# Patient Record
Sex: Female | Born: 1955
Health system: Southern US, Community
[De-identification: ages and names within clinical notes are randomized; demographics above are authoritative.]

## PROBLEM LIST (undated history)

## (undated) DIAGNOSIS — N739 Female pelvic inflammatory disease, unspecified: Secondary | ICD-10-CM

## (undated) DIAGNOSIS — N809 Endometriosis, unspecified: Secondary | ICD-10-CM

## (undated) DIAGNOSIS — E785 Hyperlipidemia, unspecified: Secondary | ICD-10-CM

## (undated) DIAGNOSIS — D649 Anemia, unspecified: Secondary | ICD-10-CM

## (undated) DIAGNOSIS — N946 Dysmenorrhea, unspecified: Secondary | ICD-10-CM

## (undated) DIAGNOSIS — N979 Female infertility, unspecified: Secondary | ICD-10-CM

## (undated) DIAGNOSIS — E349 Endocrine disorder, unspecified: Secondary | ICD-10-CM

## (undated) DIAGNOSIS — J45909 Unspecified asthma, uncomplicated: Secondary | ICD-10-CM

## (undated) HISTORY — DX: Endocrine disorder, unspecified: E34.9

## (undated) HISTORY — DX: Endometriosis, unspecified: N80.9

## (undated) HISTORY — DX: Female infertility, unspecified: N97.9

## (undated) HISTORY — DX: Female pelvic inflammatory disease, unspecified: N73.9

## (undated) HISTORY — PX: PELVIC LAPAROSCOPY: SHX162

## (undated) HISTORY — DX: Anemia, unspecified: D64.9

## (undated) HISTORY — DX: Hyperlipidemia, unspecified: E78.5

## (undated) HISTORY — PX: ABDOMINAL SURGERY: SHX537

## (undated) HISTORY — DX: Dysmenorrhea, unspecified: N94.6

## (undated) HISTORY — PX: ROTATOR CUFF REPAIR: SHX139

---

## 2004-01-01 ENCOUNTER — Other Ambulatory Visit: Admission: RE | Admit: 2004-01-01 | Discharge: 2004-01-01 | Payer: Self-pay | Admitting: Family Medicine

## 2005-01-06 ENCOUNTER — Other Ambulatory Visit: Admission: RE | Admit: 2005-01-06 | Discharge: 2005-01-06 | Payer: Self-pay | Admitting: Family Medicine

## 2007-12-26 ENCOUNTER — Other Ambulatory Visit: Admission: RE | Admit: 2007-12-26 | Discharge: 2007-12-26 | Payer: Self-pay | Admitting: Family Medicine

## 2010-01-15 ENCOUNTER — Other Ambulatory Visit: Admission: RE | Admit: 2010-01-15 | Discharge: 2010-01-15 | Payer: Self-pay | Admitting: Family Medicine

## 2011-06-07 ENCOUNTER — Ambulatory Visit (INDEPENDENT_AMBULATORY_CARE_PROVIDER_SITE_OTHER): Payer: BC Managed Care – PPO | Admitting: Internal Medicine

## 2011-06-07 DIAGNOSIS — R0989 Other specified symptoms and signs involving the circulatory and respiratory systems: Secondary | ICD-10-CM

## 2011-06-07 DIAGNOSIS — R0609 Other forms of dyspnea: Secondary | ICD-10-CM

## 2011-06-07 LAB — PULMONARY FUNCTION TEST

## 2011-06-07 NOTE — Progress Notes (Signed)
PFT done today. 

## 2011-06-14 ENCOUNTER — Encounter: Payer: Self-pay | Admitting: Family Medicine

## 2011-06-21 ENCOUNTER — Other Ambulatory Visit: Payer: Self-pay | Admitting: Family Medicine

## 2011-06-21 ENCOUNTER — Ambulatory Visit
Admission: RE | Admit: 2011-06-21 | Discharge: 2011-06-21 | Disposition: A | Discharge status: Home / Self Care | Payer: BC Managed Care – PPO | Source: Ambulatory Visit | Attending: Family Medicine | Admitting: Family Medicine

## 2011-06-21 DIAGNOSIS — R05 Cough: Secondary | ICD-10-CM

## 2012-06-26 ENCOUNTER — Emergency Department (HOSPITAL_COMMUNITY)
Admission: EM | Admit: 2012-06-26 | Discharge: 2012-06-26 | Disposition: A | Discharge status: Home / Self Care | Payer: BC Managed Care – PPO | Attending: Emergency Medicine | Admitting: Emergency Medicine

## 2012-06-26 ENCOUNTER — Emergency Department (HOSPITAL_COMMUNITY): Payer: BC Managed Care – PPO

## 2012-06-26 ENCOUNTER — Encounter (HOSPITAL_COMMUNITY): Payer: Self-pay | Admitting: *Deleted

## 2012-06-26 DIAGNOSIS — J45909 Unspecified asthma, uncomplicated: Secondary | ICD-10-CM | POA: Insufficient documentation

## 2012-06-26 DIAGNOSIS — J189 Pneumonia, unspecified organism: Secondary | ICD-10-CM | POA: Insufficient documentation

## 2012-06-26 DIAGNOSIS — Z87891 Personal history of nicotine dependence: Secondary | ICD-10-CM | POA: Insufficient documentation

## 2012-06-26 HISTORY — DX: Unspecified asthma, uncomplicated: J45.909

## 2012-06-26 LAB — URINALYSIS, ROUTINE W REFLEX MICROSCOPIC
Glucose, UA: NEGATIVE mg/dL
Ketones, ur: 15 mg/dL — AB
Protein, ur: NEGATIVE mg/dL
Urobilinogen, UA: 2 mg/dL — ABNORMAL HIGH (ref 0.0–1.0)

## 2012-06-26 LAB — CBC WITH DIFFERENTIAL/PLATELET
Basophils Absolute: 0 10*3/uL (ref 0.0–0.1)
Eosinophils Absolute: 0 10*3/uL (ref 0.0–0.7)
Eosinophils Relative: 0 % (ref 0–5)
HCT: 34.2 % — ABNORMAL LOW (ref 36.0–46.0)
Lymphocytes Relative: 21 % (ref 12–46)
Lymphs Abs: 3.1 10*3/uL (ref 0.7–4.0)
MCH: 28.6 pg (ref 26.0–34.0)
MCV: 86.6 fL (ref 78.0–100.0)
Monocytes Absolute: 0.9 10*3/uL (ref 0.1–1.0)
RDW: 13.3 % (ref 11.5–15.5)
WBC: 14.7 10*3/uL — ABNORMAL HIGH (ref 4.0–10.5)

## 2012-06-26 LAB — URINE MICROSCOPIC-ADD ON

## 2012-06-26 LAB — COMPREHENSIVE METABOLIC PANEL
CO2: 25 mEq/L (ref 19–32)
Calcium: 9.7 mg/dL (ref 8.4–10.5)
Creatinine, Ser: 0.75 mg/dL (ref 0.50–1.10)
GFR calc Af Amer: >90 mL/min (ref >90)
GFR calc non Af Amer: >90 mL/min (ref >90)
Glucose, Bld: 91 mg/dL (ref 70–99)
Total Protein: 7.4 g/dL (ref 6.0–8.3)

## 2012-06-26 MED ORDER — AZITHROMYCIN 250 MG PO TABS
500.0000 mg | ORAL_TABLET | Freq: Once | ORAL | Status: AC
  Administered 2012-06-26: 500 mg via ORAL
  Filled 2012-06-26: qty 2

## 2012-06-26 MED ORDER — AEROCHAMBER Z-STAT PLUS/MEDIUM MISC: 1.0000 | Freq: Once | Status: DC

## 2012-06-26 MED ORDER — AEROCHAMBER PLUS W/MASK MISC
Status: AC
  Filled 2012-06-26: qty 1

## 2012-06-26 MED ORDER — ALBUTEROL SULFATE HFA 108 (90 BASE) MCG/ACT IN AERS
2.0000 | INHALATION_SPRAY | RESPIRATORY_TRACT | Status: DC | PRN
  Administered 2012-06-26: 2 via RESPIRATORY_TRACT
  Filled 2012-06-26: qty 6.7

## 2012-06-26 MED ORDER — AZITHROMYCIN 250 MG PO TABS: 250.0000 mg | ORAL_TABLET | Freq: Every day | ORAL | Status: AC

## 2012-06-26 MED ORDER — AMOXICILLIN 500 MG PO CAPS: 1000.0000 mg | ORAL_CAPSULE | Freq: Three times a day (TID) | ORAL | Status: AC

## 2012-06-26 NOTE — ED Provider Notes (Signed)
History     CSN: [id number]  Arrival date & time 06/26/12  1839   First MD Initiated Contact with Patient 06/26/12 2132      Chief Complaint  Patient presents with  . ill for 3 weeks     (Consider location/radiation/quality/duration/timing/severity/associated sxs/prior treatment) HPI Comments: Patient presents today with past medical history of asthma and a former smoker with the chief complaint of shortness of breath and chest pain. She states she has been short of breath for the past 3 weeks. She reports fatigue, chills, night sweats and off and on fevers up to 101F. She reports a productive cough for the past 3 weeks consisting of clear to green sputum. Recently she has noticed specks of blood in her sputum. He has been nauseated and has had a decreased appetite. She reports losing 10 lbs within the last 2 weeks. Her chest pain in worse with inspiration and coughing. She denies vomiting, rhinorrhea, congestion, ear pain and abdominal pain. She has been using her albuterol inhaler twice daily but it has not helped with her breathing. Her mother and sister have been diagnosed with pneumonia within the past two weeks. She denies recent travel and history of pneumonia and tuberculosis.  The history is provided by the patient.    Past Medical History  Diagnosis Date  . Asthma     History reviewed. No pertinent past surgical history.  No family history on file.  History  Substance Use Topics  . Smoking status: Current Everyday Smoker  . Smokeless tobacco: Not on file  . Alcohol Use: No    OB History    Grav Para Term Preterm Abortions TAB SAB Ect Mult Living                  Review of Systems  Constitutional: Positive for fever, chills, appetite change (decreased) and fatigue.  HENT: Negative for ear pain, congestion, sore throat, rhinorrhea and sinus pressure.   Eyes: Negative for redness.  Respiratory: Positive for cough (productive), chest tightness and shortness of  breath.   Cardiovascular: Negative for chest pain.  Gastrointestinal: Positive for nausea. Negative for vomiting, abdominal pain, diarrhea and constipation.  Genitourinary: Negative for dysuria.  Musculoskeletal: Negative for myalgias.  Skin: Negative for rash.  Neurological: Negative for headaches.    Allergies  Review of patient's allergies indicates no known allergies.  Home Medications   Current Outpatient Rx  Name Route Sig Dispense Refill  . ALBUTEROL SULFATE HFA 108 (90 BASE) MCG/ACT IN AERS Inhalation Inhale 2 puffs into the lungs every 6 (six) hours as needed. For shortness of breath    . BC HEADACHE POWDER PO Oral Take 1 Package by mouth daily as needed. For head ache      BP 110/79  Pulse 101  Temp 100.1 F (37.8 C) (Oral)  Resp 18  SpO2 97%  Physical Exam  Nursing note and vitals reviewed. Constitutional: She appears well-developed and well-nourished.       Thin female in no acute distress  HENT:  Head: Normocephalic and atraumatic.  Eyes: Conjunctivae are normal. Right eye exhibits no discharge. Left eye exhibits no discharge.  Neck: Normal range of motion. Neck supple.  Cardiovascular: Regular rhythm, normal heart sounds and normal pulses.  Tachycardia present.   No murmur heard. Pulmonary/Chest: Effort normal. No respiratory distress. She has wheezes (very mild, bases). She has no rales.  Abdominal: Soft. Bowel sounds are normal. There is no tenderness.  Musculoskeletal:  No LE edema   Neurological: She is alert.  Skin: Skin is warm and dry. No rash noted.  Psychiatric: She has a normal mood and affect.    ED Course  Procedures (including critical care time)  Labs Reviewed  URINALYSIS, ROUTINE W REFLEX MICROSCOPIC - Abnormal; Notable for the following:    APPearance HAZY (*)     Hgb urine dipstick MODERATE (*)     Bilirubin Urine SMALL (*)     Ketones, ur 15 (*)     Urobilinogen, UA 2.0 (*)     All other components within normal limits  CBC  WITH DIFFERENTIAL - Abnormal; Notable for the following:    WBC 14.7 (*)     Hemoglobin 11.3 (*)     HCT 34.2 (*)     Neutro Abs 10.7 (*)     All other components within normal limits  URINE MICROSCOPIC-ADD ON - Abnormal; Notable for the following:    Squamous Epithelial / LPF FEW (*)     All other components within normal limits  COMPREHENSIVE METABOLIC PANEL   Dg Chest 2 View  06/26/2012  *RADIOLOGY REPORT*  Clinical Data: Shortness of breath for 1 month.  CHEST - 2 VIEW  Comparison: 06/21/2011  Findings: Two views of the chest again demonstrate hyperinflation. Heart and mediastinum are within normal limits and stable.  Trachea is midline. There are streaky densities at the left lung base which have progressed.  IMPRESSION:  New streaky and patchy densities at the left lung base.  The findings could represent areas of atelectasis but also concerning for areas of infection.  Recommend follow-up to ensure resolution.   Original Report Authenticated By: Richarda Overlie, M.D.      1. Community acquired pneumonia     10:31 PM Patient seen and examined. Work-up reviewed. Medications ordered.   Vital signs reviewed and are as follows: Filed Vitals:   06/26/12 1842  BP: 110/79  Pulse: 101  Temp: 100.1 F (37.8 C)  Resp: 18   Will treat for pneumonia with amox and azithro. Patient does not have any other serious chronic issues that would prohibit discharge to home. Patient appears well. Discussed with Dr. Preston Fleeting.    MDM  Patient with signs and symptoms, chest x-ray consistent with community-acquired pneumonia. Given history of possible COPD Will treat with azithromycin and amoxicillin. CURB-65 score indicates patient is low risk for mortality. She appears well and is not in any respiratory distress at time of discharge. History and exam findings are not suspicious for pulmonary embolism.        Renne Crigler, [place] 06/26/12 [phone]

## 2012-06-26 NOTE — ED Provider Notes (Signed)
ECG shows normal sinus rhythm with a rate of 96, no ectopy. Normal axis. Normal P wave. Normal QRS. Normal intervals. Normal ST and T waves. Impression: normal ECG. No prior ECG available for comparison.   Dione Booze, MD 06/26/12 [phone]

## 2012-06-26 NOTE — ED Notes (Signed)
The pt has been ill for 3 weeks she is having chest pain and difficulty breathing with a elevated temp intermittently.  Productive cough.

## 2012-06-28 NOTE — ED Provider Notes (Signed)
Medical screening examination/treatment/procedure(s) were conducted as a shared visit with non-physician practitioner(s) and myself.  I personally evaluated the patient during the encounter   Dione Booze, MD 06/28/12 0005

## 2013-04-30 ENCOUNTER — Other Ambulatory Visit (HOSPITAL_COMMUNITY)
Admission: RE | Admit: 2013-04-30 | Discharge: 2013-04-30 | Disposition: A | Discharge status: Home / Self Care | Payer: BC Managed Care – PPO | Source: Ambulatory Visit | Attending: Family Medicine | Admitting: Family Medicine

## 2013-04-30 ENCOUNTER — Other Ambulatory Visit: Payer: Self-pay | Admitting: Family Medicine

## 2013-04-30 DIAGNOSIS — Z124 Encounter for screening for malignant neoplasm of cervix: Secondary | ICD-10-CM | POA: Insufficient documentation

## 2013-04-30 DIAGNOSIS — Z1151 Encounter for screening for human papillomavirus (HPV): Secondary | ICD-10-CM | POA: Insufficient documentation

## 2015-05-28 ENCOUNTER — Ambulatory Visit: Payer: BLUE CROSS/BLUE SHIELD | Attending: Family Medicine | Admitting: Physical Therapy

## 2015-05-28 DIAGNOSIS — M25511 Pain in right shoulder: Secondary | ICD-10-CM | POA: Insufficient documentation

## 2015-05-28 DIAGNOSIS — R293 Abnormal posture: Secondary | ICD-10-CM | POA: Insufficient documentation

## 2015-05-28 DIAGNOSIS — R29898 Other symptoms and signs involving the musculoskeletal system: Secondary | ICD-10-CM | POA: Diagnosis present

## 2015-05-28 DIAGNOSIS — M25611 Stiffness of right shoulder, not elsewhere classified: Secondary | ICD-10-CM

## 2015-05-28 NOTE — Patient Instructions (Signed)
   Maayan Jenning PT, DPT, LAT, ATC  [facility] Phone: [phone]     

## 2015-05-28 NOTE — Therapy (Signed)
[facility] [address] [place], [place], [address] Phone: [phone]   Fax:  [phone]  Physical Therapy Evaluation  Patient Details  Name: Nicole Joyce MRN: [id number] Date of Birth: [birth year] Referring Provider:  Darrow Bussing, MD  Encounter Date: 05/28/2015      PT End of Session - 05/28/15 0855    Visit Number 1   Number of Visits 12   Date for PT Re-Evaluation 07/09/15   PT Start Time 0810   PT Stop Time 0855   PT Time Calculation (min) 45 min   Activity Tolerance Patient tolerated treatment well   Behavior During Therapy [facility] for tasks assessed/performed      Past Medical History  Diagnosis Date  . Asthma     No past surgical history on file.  There were no vitals filed for this visit.  Visit Diagnosis:  Right shoulder pain - Plan: PT plan of care cert/re-cert  Weakness of right arm - Plan: PT plan of care cert/re-cert  Decreased ROM of right shoulder - Plan: PT plan of care cert/re-cert  Posture abnormality - Plan: PT plan of care cert/re-cert      Subjective Assessment - 05/28/15 0815    Subjective pt is a 59 y.o F with CC of R shoulder pain that has been going on for the last 2 months whihc she reports was insidious onset. pt reports having R Rotator cuff surgery in 2012 and has had any problems since until this. She states it has gradually been getting worse.    Limitations Lifting;House hold activities   How long can you sit comfortably? pt reports 2 min depends on surface   How long can you stand comfortably? unlimited   How long can you walk comfortably? unlimited   Diagnostic tests nothing recent since her surgery in 2012   Patient Stated Goals to get strength, ROM, to be able to everyday activities   Currently in Pain? Yes   Pain Score 5    Pain Location Shoulder   Pain Orientation Right   Pain Descriptors / Indicators Aching;Other (Comment)  weakn   Pain Type Chronic pain   Pain Onset  More than a month ago   Pain Frequency Constant   Aggravating Factors  moving the arm around, any activity   Pain Relieving Factors heat,            [facility] PT Assessment - 05/28/15 0821    Assessment   Medical Diagnosis R shoulder pain    Onset Date/Surgical Date --  2 months   Hand Dominance Right   Next MD Visit 3-4 weeks  pt wasn't completely    Prior Therapy no   Precautions   Precautions None   Restrictions   Weight Bearing Restrictions No   Balance Screen   Has the patient fallen in the past 6 months No   Has the patient had a decrease in activity level because of a fear of falling?  No   Is the patient reluctant to leave their home because of a fear of falling?  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   Vocation Requirements  lifting, carrying, moving around, walking, standing, squating   Leisure walking, exercise, water aerobics,    Cognition   Overall Cognitive Status Within Functional Limits for tasks assessed   Observation/Other Assessments   Focus on Therapeutic Outcomes (FOTO)  55% limited  predicted 34% limited   Posture/Postural Control   Posture/Postural Control Postural limitations   Postural Limitations Rounded Shoulders;Forward head  mild/mod winging of scapul on R   ROM / Strength   AROM / PROM / Strength AROM;PROM;Strength   AROM   AROM Assessment Site Shoulder   Right/Left Shoulder Right;Left   Right Shoulder Extension 40 Degrees  pt reported trembling    Right Shoulder Flexion 68 Degrees  diffiuclty maintaining position   Right Shoulder ABduction 82 Degrees  pain at endrange of  available motion   Right Shoulder Internal Rotation 38 Degrees  pain   Right Shoulder External Rotation 66 Degrees   Left Shoulder Extension 60 Degrees   Left Shoulder Flexion 142 Degrees   Left Shoulder ABduction 112 Degrees   Left Shoulder Internal Rotation 76 Degrees   Left Shoulder External Rotation 82 Degrees   PROM   PROM Assessment Site Shoulder   Right/Left Shoulder Right;Left   Right Shoulder Extension 62 Degrees   Right Shoulder Flexion 142 Degrees   Right Shoulder ABduction 130 Degrees   Right Shoulder Internal Rotation 40 Degrees   Right Shoulder External Rotation 82 Degrees   Strength   Strength Assessment Site Shoulder   Right/Left Shoulder Right;Left   Right Shoulder Flexion 4-/5   Right Shoulder Extension 4-/5   Right Shoulder ABduction 3/5   Right Shoulder Internal Rotation 4-/5   Right Shoulder External Rotation 4-/5   Left Shoulder Flexion 4/5   Left Shoulder Extension 4/5   Left Shoulder ABduction 4/5   Left Shoulder Internal Rotation 4/5   Left Shoulder External Rotation 4/5   Palpation   Palpation comment tenderness noted along the supraspinatus belly and tightness of the R upper trap with referral of pain to the lower apsect of the occiputal bone. Mild tenderness at the lesser tubercle of the shoulder.   Special Tests    Special Tests Rotator Cuff Impingement   Rotator Cuff Impingment tests Leanord Asal test;Neer impingement test;Drop Arm test;Painful Arc of Motion;other   Neer Impingement test    Findings Positive   Side Right   Hawkins-Kennedy test   Findings Positive   Side Right   Drop Arm test   Findings Positive   Side Right   Painful Arc of Motion   Findings Unable to test   other   Findings Positive   Comments scapular assist test                           PT Education - 05/28/15 0854    Education provided Yes   Education Details evaluation findings, POC, Goals, HEP, anatomical education   Person(s) Educated  Patient   Methods Explanation   Comprehension Verbalized understanding          PT Short Term Goals - 05/28/15 1216    PT SHORT TERM GOAL #1   Title pt will be I with basic HEP (06/18/2015)   Time 3   Period Weeks   Status New   PT SHORT TERM GOAL #2   Title pt will demonstrate <3/10 pain in the R shoulder during AROM activity to asisst wtih functional progression (06/18/2015)   Time 3   Period Weeks  Status New   PT SHORT TERM GOAL #3   Title She will increase her AROM by > 15 degrees with R shoulder flexion/abduction to help with ADLs(06/18/2015)   Time 3   Period Weeks   Status New   PT SHORT TERM GOAL #4   Title pt will increase FOTO score by > 10 points to help demonstrate incrased functional capacity (06/18/2015)   Time 3   Period Weeks   Status New           PT Long Term Goals - 05/28/15 1219    PT LONG TERM GOAL #1   Title upon discharge pt will be I with all HEP given throughout therapy (07/09/2015)   Time 6   Period Weeks   Status New   PT LONG TERM GOAL #2   Title pt will increase R shoulder AROM to [facility] with < 2/10 pain to help with personal hygiene and job related activities (07/09/2015)   Time 6   Period Weeks   Status New   PT LONG TERM GOAL #3   Title pt will demonstrate increased R shoulder strength to > 4/5 in all planes to help with lifting and carrying activities associated with her job (07/09/2015)   Time 6   Period Weeks   Status New   PT LONG TERM GOAL #4   Title She will be able to verbalize and demonstrate techniques to reduce R shoulder reinjury via postural awareness, lifting and carrying mechanics, and HEP (07/09/2015)   Time 6   Period Weeks   Status New   PT LONG TERM GOAL #5   Title pt will increase her FOTO score to > 66 to demonstrate improved funcitonal capacity upon discharge (07/09/2015)   Time 6   Period Weeks   Status New               Plan - 05/28/15 0855    Clinical Impression Statement Robbin presents to OPPT with CC  or R shoulder pain that has been going on for about 2 months. She has a hx or R shoulder rotator cuff surgery in 2012.  she demonstrates limited R Shoulder AROM and strength secondary to pain. she demonstrates forward head posture, ant rolled  shoulders with mild/mod scapular winging on the R. She demonstrates tenderness in the muscle belly of the supraspinatus and tighness of the upper trap with mild maintained shoulder hike.  special testing wa spostive for hawkins-kennedy, neers, scapualr assist suggesting possible impingement. She would benefit from skilled physical therapy to maximize her funciton and decrease pain by addressing the impairments listed.    Pt will benefit from skilled therapeutic intervention in order to improve on the following deficits Decreased activity tolerance;Decreased endurance;Decreased strength;Postural dysfunction;Improper body mechanics;Pain;Hypomobility;Impaired flexibility;Impaired UE functional use;Decreased range of motion   Rehab Potential Good   PT Frequency 2x / week   PT Duration 6 weeks   PT Treatment/Interventions ADLs/Self Care Home Management;Cryotherapy;Electrical Stimulation;Iontophoresis 4mg /ml Dexamethasone;Moist Heat;Ultrasound;Therapeutic activities;Therapeutic exercise;Neuromuscular re-education;Manual techniques;Passive range of motion;Dry needling;Taping   PT Next Visit Plan assess response to HEP, scapular stabilization, shoulder strengthening, modalities PRN   PT Home Exercise Plan see HEP handout   Consulted and Agree with Plan of Care Patient         Problem List There are no active problems to display for this patient.  Lulu Riding PT, DPT, LAT, ATC  05/28/2015  12:27 PM      [facility] [address] [place], [place],  [place] Phone: [phone]   Fax:  [phone]

## 2015-06-04 ENCOUNTER — Ambulatory Visit: Payer: BLUE CROSS/BLUE SHIELD | Admitting: Physical Therapy

## 2015-06-04 DIAGNOSIS — R293 Abnormal posture: Secondary | ICD-10-CM

## 2015-06-04 DIAGNOSIS — M25611 Stiffness of right shoulder, not elsewhere classified: Secondary | ICD-10-CM

## 2015-06-04 DIAGNOSIS — M25511 Pain in right shoulder: Secondary | ICD-10-CM

## 2015-06-04 DIAGNOSIS — R29898 Other symptoms and signs involving the musculoskeletal system: Secondary | ICD-10-CM

## 2015-06-04 NOTE — Patient Instructions (Signed)
   Jensen Kilburg PT, DPT, LAT, ATC  [facility] Phone: [phone]     

## 2015-06-04 NOTE — Therapy (Signed)
[facility] [address] [place], [place], [address] Phone: [phone]   Fax:  [phone]  Physical Therapy Treatment  Patient Details  Name: Nicole Joyce MRN: [id number] Date of Birth: June 08, 1956 Referring Provider:  Darrow Bussing, MD  Encounter Date: 06/04/2015      PT End of Session - 06/04/15 0846    Visit Number 2   Number of Visits 12   Date for PT Re-Evaluation 07/09/15   PT Start Time 0800   PT Stop Time 0845   PT Time Calculation (min) 45 min   Activity Tolerance Patient tolerated treatment well   Behavior During Therapy [facility] for tasks assessed/performed      Past Medical History  Diagnosis Date  . Asthma     No past surgical history on file.  There were no vitals filed for this visit.  Visit Diagnosis:  Right shoulder pain  Weakness of right arm  Decreased ROM of right shoulder  Posture abnormality      Subjective Assessment - 06/04/15 0804    Subjective "my shoulder is doing a little better, I still have pain when I reach above my head" pt reports that she is able to sleep more   Currently in Pain? Yes   Pain Score 3    Pain Location Shoulder   Pain Orientation Right   Pain Descriptors / Indicators Aching   Pain Onset More than a month ago   Pain Frequency Intermittent   Aggravating Factors  lifting arm above head   Pain Relieving Factors heat                         [facility] Adult PT Treatment/Exercise - 06/04/15 0806    Shoulder Exercises: Supine   Horizontal ABduction AROM;Strengthening;Both;15 reps;Theraband   Theraband Level (Shoulder Horizontal ABduction) Level 1 (Yellow)   Other Supine Exercises D2 x 10 with RUE only  unresisted to get motion, plan to progress resistance    Shoulder Exercises: Standing   External Rotation AROM;Strengthening;Right;12 reps   Theraband Level (Shoulder External Rotation) Level 2 (Red)   Internal Rotation  AROM;Strengthening;Right;Theraband;12 reps   Theraband Level (Shoulder Internal Rotation) Level 2 (Red)   Flexion AROM;Strengthening;Right;12 reps  scaption angle   Theraband Level (Shoulder Flexion) Level 1 (Yellow)   Extension AROM;Strengthening;Both;15 reps   Theraband Level (Shoulder Extension) Level 2 (Red)   Row AROM;Strengthening;Both;15 reps;Theraband   Theraband Level (Shoulder Row) Level 2 (Red)   Other Standing Exercises bicep curls 2 x 10  4#   Other Standing Exercises wall push-ups   2 x 10   Shoulder Exercises: ROM/Strengthening   UBE (Upper Arm Bike) L 1 x 5 min  alt direction every 2.5 min   Shoulder Exercises: Stretch   Corner Stretch 2 reps;30 seconds   Internal Rotation Stretch 30 seconds   Other Shoulder Stretches 3 bicep stretch against wall  1 with shoulder IR/ ER/ nuetral                PT Education - 06/04/15 0846    Education provided Yes   Education Details updated HEP    Person(s) Educated Patient   Methods Explanation   Comprehension Verbalized understanding;Returned demonstration          PT Short Term Goals - 06/04/15 0850    PT SHORT TERM GOAL #1   Title pt will be I with basic HEP (06/18/2015)   Time 3   Period Weeks   Status  On-going   PT SHORT TERM GOAL #2   Title pt will demonstrate <3/10 pain in the R shoulder during AROM activity to asisst wtih functional progression (06/18/2015)   Time 3   Period Weeks   Status On-going   PT SHORT TERM GOAL #3   Title She will increase her AROM by > 15 degrees with R shoulder flexion/abduction to help with ADLs(06/18/2015)   Time 3   Period Weeks   Status On-going   PT SHORT TERM GOAL #4   Title pt will increase FOTO score by > 10 points to help demonstrate incrased functional capacity (06/18/2015)   Time 3   Period Weeks   Status On-going           PT Long Term Goals - 06/04/15 0850    PT LONG TERM GOAL #1   Title upon discharge pt will be I with all HEP given throughout therapy  (07/09/2015)   Time 6   Period Weeks   Status On-going   PT LONG TERM GOAL #2   Title pt will increase R shoulder AROM to [facility] with < 2/10 pain to help with personal hygiene and job related activities (07/09/2015)   Time 6   Period Weeks   Status On-going   PT LONG TERM GOAL #3   Title pt will demonstrate increased R shoulder strength to > 4/5 in all planes to help with lifting and carrying activities associated with her job (07/09/2015)   Time 6   Period Weeks   Status On-going   PT LONG TERM GOAL #4   Title She will be able to verbalize and demonstrate techniques to reduce R shoulder reinjury via postural awareness, lifting and carrying mechanics, and HEP (07/09/2015)   Time 6   Period Weeks   Status On-going   PT LONG TERM GOAL #5   Title pt will increase her FOTO score to > 66 to demonstrate improved funcitonal capacity upon discharge (07/09/2015)   Time 6   Period Weeks   Status On-going               Plan - 06/04/15 0847    Clinical Impression Statement Nicole Joyce reports to therapy today with report of doing her HEP consistently and that it has been helping. She was able to perform all exercises well without increased pain or symptoms with CC of feeling fatigued. Following todays session she reported her pain dropped to a 1/10 and that she was able to flex/abduct her shoulder better without as much difficulty. Plan to progress with strengthening as tolerated.    PT Next Visit Plan scapular stabilization, shoulder strengthening, modalities PRN   PT Home Exercise Plan Scaption, wall push-ups   Consulted and Agree with Plan of Care Patient        Problem List There are no active problems to display for this patient.  Lulu Riding PT, DPT, LAT, ATC  06/04/2015  8:53 AM      [facility] [address] [place], [place], [address] Phone: [phone]   Fax:  [phone]

## 2015-06-08 ENCOUNTER — Ambulatory Visit: Payer: BLUE CROSS/BLUE SHIELD | Admitting: Physical Therapy

## 2015-06-08 DIAGNOSIS — M25611 Stiffness of right shoulder, not elsewhere classified: Secondary | ICD-10-CM

## 2015-06-08 DIAGNOSIS — R293 Abnormal posture: Secondary | ICD-10-CM

## 2015-06-08 DIAGNOSIS — M25511 Pain in right shoulder: Secondary | ICD-10-CM

## 2015-06-08 DIAGNOSIS — R29898 Other symptoms and signs involving the musculoskeletal system: Secondary | ICD-10-CM

## 2015-06-08 NOTE — Therapy (Signed)
[facility] [address] [place], [place], [address] Phone: [phone]   Fax:  [phone]  Physical Therapy Treatment  Patient Details  Name: Nicole Joyce MRN: [id number] Date of Birth: 1955/12/04 Referring Provider:  Darrow Bussing, MD  Encounter Date: 06/08/2015      PT End of Session - 06/08/15 1020    Visit Number 3   Number of Visits 12   Date for PT Re-Evaluation 07/09/15   PT Start Time 0800   PT Stop Time 0845   PT Time Calculation (min) 45 min   Activity Tolerance Patient tolerated treatment well   Behavior During Therapy [facility] for tasks assessed/performed      Past Medical History  Diagnosis Date  . Asthma     No past surgical history on file.  There were no vitals filed for this visit.  Visit Diagnosis:  Right shoulder pain  Weakness of right arm  Decreased ROM of right shoulder  Posture abnormality      Subjective Assessment - 06/08/15 0807    Subjective "I am doing very well this morning, I haven't had much problem with my R shoulder" pt reports her pain today was maybe a 1/10   Currently in Pain? Yes   Pain Score 1    Pain Location Shoulder   Pain Orientation Right   Pain Descriptors / Indicators Aching   Pain Type Chronic pain   Pain Onset More than a month ago   Pain Frequency Occasional   Aggravating Factors  stiffness in the morning            [facility] PT Assessment - 06/08/15 0841    AROM   Right Shoulder Extension 48 Degrees   Right Shoulder Flexion 148 Degrees   Right Shoulder ABduction 134 Degrees   Right Shoulder Internal Rotation --  thumb to T10   Strength   Right Shoulder Flexion 4/5   Right Shoulder Extension 4/5   Right Shoulder ABduction 4-/5   Right Shoulder Internal Rotation 4/5   Right Shoulder External Rotation 4/5                     [facility] Adult PT Treatment/Exercise - 06/08/15 0809    Shoulder Exercises: Supine   Protraction  AROM;Strengthening;Right;15 reps  4#   Horizontal ABduction AROM;Strengthening;Both;15 reps;Theraband  with flexion/extension maintaining resistance   Theraband Level (Shoulder Horizontal ABduction) Level 2 (Red)   Shoulder Exercises: Standing   External Rotation AROM;Strengthening;Right;15 reps   Theraband Level (Shoulder External Rotation) Level 3 (Green)   Internal Rotation AROM;Strengthening;Right;Theraband;15 reps   Theraband Level (Shoulder Internal Rotation) Level 3 (Green)   Flexion AROM;Strengthening;Right;15 reps   Theraband Level (Shoulder Flexion) Level 2 (Red)   Extension AROM;Strengthening;Both;15 reps   Theraband Level (Shoulder Extension) Level 4 (Blue)   Row AROM;Strengthening;Both;15 reps;Theraband   Theraband Level (Shoulder Row) Level 4 (Blue)   Other Standing Exercises bicep curls 2 x 10 with 5#, putting weights into cabinet x 5 each of 3,4,and 5#  5#   Other Standing Exercises --   Shoulder Exercises: ROM/Strengthening   UBE (Upper Arm Bike) L1.5 x 8 min  alt dir every 2 min   Shoulder Exercises: Stretch   Corner Stretch 2 reps;30 seconds   Internal Rotation Stretch 2 reps   Other Shoulder Stretches upper trap stretch 2 x 30 sec   in sitting, holding on to the table with trunk lean   Other Shoulder Stretches Levator stretch 2 x 30 sec  Shoulder Exercises: Body Blade   Flexion 15 seconds  x 3 sets   ABduction 15 seconds  x 3 sets                PT Education - 06/08/15 1020    Education provided Yes   Education Details updated HEP   Person(s) Educated Patient   Methods Explanation   Comprehension Verbalized understanding          PT Short Term Goals - 06/04/15 0850    PT SHORT TERM GOAL #1   Title pt will be I with basic HEP (06/18/2015)   Time 3   Period Weeks   Status On-going   PT SHORT TERM GOAL #2   Title pt will demonstrate <3/10 pain in the R shoulder during AROM activity to asisst wtih functional progression (06/18/2015)   Time 3    Period Weeks   Status On-going   PT SHORT TERM GOAL #3   Title She will increase her AROM by > 15 degrees with R shoulder flexion/abduction to help with ADLs(06/18/2015)   Time 3   Period Weeks   Status On-going   PT SHORT TERM GOAL #4   Title pt will increase FOTO score by > 10 points to help demonstrate incrased functional capacity (06/18/2015)   Time 3   Period Weeks   Status On-going           PT Long Term Goals - 06/04/15 0850    PT LONG TERM GOAL #1   Title upon discharge pt will be I with all HEP given throughout therapy (07/09/2015)   Time 6   Period Weeks   Status On-going   PT LONG TERM GOAL #2   Title pt will increase R shoulder AROM to [facility] with < 2/10 pain to help with personal hygiene and job related activities (07/09/2015)   Time 6   Period Weeks   Status On-going   PT LONG TERM GOAL #3   Title pt will demonstrate increased R shoulder strength to > 4/5 in all planes to help with lifting and carrying activities associated with her job (07/09/2015)   Time 6   Period Weeks   Status On-going   PT LONG TERM GOAL #4   Title She will be able to verbalize and demonstrate techniques to reduce R shoulder reinjury via postural awareness, lifting and carrying mechanics, and HEP (07/09/2015)   Time 6   Period Weeks   Status On-going   PT LONG TERM GOAL #5   Title pt will increase her FOTO score to > 66 to demonstrate improved funcitonal capacity upon discharge (07/09/2015)   Time 6   Period Weeks   Status On-going               Plan - 06/08/15 1020    Clinical Impression Statement Shermaine present to therpay today with report that she is doing much better since the last visit. She was able perform and complete all exercises without complaint except for faitgue with lifting weights into the cabinet. updated hep to include upper trap and levator stretching. She has improved  her AROM and strength in the R  shoulder compared bil. Plan to progress as tolerated.    PT  Next Visit Plan scapular stabilization, shoulder strengthening, modalities PRN   PT Home Exercise Plan upper trap stretch, levator scapulae stretch   Consulted and Agree with Plan of Care Patient        Problem List There are no active  problems to display for this patient.  Lulu Riding PT, DPT, LAT, ATC  06/08/2015  10:25 AM    [facility] [address] [place], [place], [address] Phone: [phone]   Fax:  [phone]

## 2015-06-08 NOTE — Patient Instructions (Signed)
   Temiloluwa Recchia PT, DPT, LAT, ATC  [facility] Phone: [phone]     

## 2015-06-11 ENCOUNTER — Ambulatory Visit: Payer: BLUE CROSS/BLUE SHIELD | Admitting: Physical Therapy

## 2015-06-15 ENCOUNTER — Ambulatory Visit: Payer: BLUE CROSS/BLUE SHIELD | Admitting: Physical Therapy

## 2015-06-15 DIAGNOSIS — R293 Abnormal posture: Secondary | ICD-10-CM

## 2015-06-15 DIAGNOSIS — M25511 Pain in right shoulder: Secondary | ICD-10-CM

## 2015-06-15 DIAGNOSIS — M25611 Stiffness of right shoulder, not elsewhere classified: Secondary | ICD-10-CM

## 2015-06-15 DIAGNOSIS — R29898 Other symptoms and signs involving the musculoskeletal system: Secondary | ICD-10-CM

## 2015-06-15 NOTE — Patient Instructions (Signed)
   Olukemi Panchal PT, DPT, LAT, ATC  [facility] Phone: [phone]     

## 2015-06-15 NOTE — Therapy (Signed)
[facility] [address] [place], [place], [address] Phone: [phone]   Fax:  [phone]  Physical Therapy Treatment  Patient Details  Name: Nicole Joyce MRN: [id number] Date of Birth: 19-Sep-1956 Referring Provider:  Darrow Bussing, MD  Encounter Date: 06/15/2015      PT End of Session - 06/15/15 1142    Visit Number 4   Number of Visits 12   Date for PT Re-Evaluation 07/09/15   PT Start Time 0800   PT Stop Time 0845   PT Time Calculation (min) 45 min   Activity Tolerance Patient tolerated treatment well   Behavior During Therapy [facility] for tasks assessed/performed      Past Medical History  Diagnosis Date  . Asthma     No past surgical history on file.  There were no vitals filed for this visit.  Visit Diagnosis:  Right shoulder pain  Weakness of right arm  Decreased ROM of right shoulder  Posture abnormality      Subjective Assessment - 06/15/15 0810    Subjective "Im not having any problems or pain since the last visit."   Currently in Pain? No/denies   Pain Score 0-No pain   Pain Location Shoulder   Pain Orientation Right                         [facility] Adult PT Treatment/Exercise - 06/15/15 0810    Shoulder Exercises: Supine   Protraction AROM;Strengthening;Right;15 reps   Protraction Weight (lbs) 4   Horizontal ABduction AROM;Strengthening;Both;15 reps;Theraband   Theraband Level (Shoulder Horizontal ABduction) Level 3 (Green)   Shoulder Exercises: Prone   Other Prone Exercises I's, T's, and Y's x 12 ea.  1#   Shoulder Exercises: Standing   External Rotation AROM;Strengthening;Right;15 reps;10 reps   Theraband Level (Shoulder External Rotation) Level 4 (Blue)   Internal Rotation AROM;Strengthening;Right;Theraband;10 reps   Theraband Level (Shoulder Internal Rotation) Level 4 (Blue)   Flexion AROM;Strengthening;Right;10 reps   Theraband Level (Shoulder Flexion) Level 2  (Red);Level 3 [place])   Extension AROM;Strengthening;Both;15 reps   Theraband Level (Shoulder Extension) Level 4 (Blue)   Row AROM;Strengthening;Both;15 reps;Theraband   Theraband Level (Shoulder Row) Level 4 (Blue)   Shoulder Elevation AROM;10 reps  3#, 4# lifting and putting into cabinet   Other Standing Exercises bicep curls 2 x 10 with 5#, putting weights into cabinet x 5 each of 3,4,and 5#   Other Standing Exercises D1/D2 red theraband 2 x 10 each   Shoulder Exercises: ROM/Strengthening   UBE (Upper Arm Bike) L1.5 x 10 min   Shoulder Exercises: Stretch   Corner Stretch 2 reps;30 seconds   Other Shoulder Stretches Levator/ upper trap stretch stretch 2 x 30 sec   Shoulder Exercises: Body Blade   Flexion 15 seconds  x 4 sets   ABduction 15 seconds  x 4 sets   Other Body Blade Exercises Elbow flexion/ extension 2 x 45 sec   Other Body Blade Exercises shouulder flexion/extension 3 x 15                PT Education - 06/15/15 1142    Education provided Yes   Education Details updated HEP   Person(s) Educated Patient   Methods Explanation   Comprehension Verbalized understanding          PT Short Term Goals - 06/04/15 0850    PT SHORT TERM GOAL #1   Title pt will be I with basic HEP (06/18/2015)  Time 3   Period Weeks   Status On-going   PT SHORT TERM GOAL #2   Title pt will demonstrate <3/10 pain in the R shoulder during AROM activity to asisst wtih functional progression (06/18/2015)   Time 3   Period Weeks   Status On-going   PT SHORT TERM GOAL #3   Title She will increase her AROM by > 15 degrees with R shoulder flexion/abduction to help with ADLs(06/18/2015)   Time 3   Period Weeks   Status On-going   PT SHORT TERM GOAL #4   Title pt will increase FOTO score by > 10 points to help demonstrate incrased functional capacity (06/18/2015)   Time 3   Period Weeks   Status On-going           PT Long Term Goals - 06/04/15 0850    PT LONG TERM GOAL #1   Title  upon discharge pt will be I with all HEP given throughout therapy (07/09/2015)   Time 6   Period Weeks   Status On-going   PT LONG TERM GOAL #2   Title pt will increase R shoulder AROM to [facility] with < 2/10 pain to help with personal hygiene and job related activities (07/09/2015)   Time 6   Period Weeks   Status On-going   PT LONG TERM GOAL #3   Title pt will demonstrate increased R shoulder strength to > 4/5 in all planes to help with lifting and carrying activities associated with her job (07/09/2015)   Time 6   Period Weeks   Status On-going   PT LONG TERM GOAL #4   Title She will be able to verbalize and demonstrate techniques to reduce R shoulder reinjury via postural awareness, lifting and carrying mechanics, and HEP (07/09/2015)   Time 6   Period Weeks   Status On-going   PT LONG TERM GOAL #5   Title pt will increase her FOTO score to > 66 to demonstrate improved funcitonal capacity upon discharge (07/09/2015)   Time 6   Period Weeks   Status On-going               Plan - 06/15/15 1142    Clinical Impression Statement Breelle continues to make progress with shoulder function and decreased pain. She was able to complete all exercises without complaint of pain or fatigue.  she has improved her FOTO to 73. Educated that if she is doing well next visit that we will plan to discuss discharge.    PT Next Visit Plan scapular stabilization, shoulder strengthening, modalities PRN, assess goals, possibly DC   PT Home Exercise Plan PNF D1/D2 and horizontal abduction   Consulted and Agree with Plan of Care Patient        Problem List There are no active problems to display for this patient.  Lulu Riding PT, DPT, LAT, ATC  06/15/2015  11:47 AM      [facility] [address] [place], [place], [address] Phone: [phone]   Fax:  [phone]

## 2015-06-18 ENCOUNTER — Ambulatory Visit: Payer: BLUE CROSS/BLUE SHIELD | Attending: Family Medicine | Admitting: Physical Therapy

## 2015-06-18 DIAGNOSIS — M25611 Stiffness of right shoulder, not elsewhere classified: Secondary | ICD-10-CM

## 2015-06-18 DIAGNOSIS — M25511 Pain in right shoulder: Secondary | ICD-10-CM | POA: Diagnosis present

## 2015-06-18 DIAGNOSIS — R29898 Other symptoms and signs involving the musculoskeletal system: Secondary | ICD-10-CM | POA: Diagnosis present

## 2015-06-18 DIAGNOSIS — R293 Abnormal posture: Secondary | ICD-10-CM

## 2015-06-18 NOTE — Patient Instructions (Signed)
   Tiffony Kite PT, DPT, LAT, ATC  [facility] Phone: [phone]     

## 2015-06-18 NOTE — Therapy (Signed)
[place], [place], [address] Phone: [phone]   Fax:  [phone]  Physical Therapy Treatment  Patient Details  Name: Nicole Joyce MRN: [id number] Date of Birth: [birth year] Referring Provider:  Lujean Amel, MD  Encounter Date: 06/18/2015      PT End of Session - 06/18/15 0836    Visit Number 5   Number of Visits 12   Date for PT Re-Evaluation 07/09/15   PT Start Time 0800   PT Stop Time 0840   PT Time Calculation (min) 40 min   Activity Tolerance Patient tolerated treatment well   Behavior During Therapy [facility] for tasks assessed/performed      Past Medical History  Diagnosis Date  . Asthma     No past surgical history on file.  There were no vitals filed for this visit.  Visit Diagnosis:  Right shoulder pain  Weakness of right arm  Decreased ROM of right shoulder  Posture abnormality      Subjective Assessment - 06/18/15 0809    Subjective "I have a little tightness in my R shoulder but haven't had really any pain"   Currently in Pain? Yes   Pain Score 1    Pain Location Shoulder   Pain Orientation Right   Pain Descriptors / Indicators Aching   Pain Type Chronic pain   Pain Onset More than a month ago   Pain Frequency Occasional   Aggravating Factors  stiffness in the morning   Pain Relieving Factors heat            [facility] PT Assessment - 06/18/15 0001    AROM   Right Shoulder Extension 60 Degrees   Right Shoulder Flexion 150 Degrees   Right Shoulder ABduction 140 Degrees   Right Shoulder Internal Rotation --  to T8    Right Shoulder External Rotation 80 Degrees   Strength   Right Shoulder Flexion 4+/5   Right Shoulder Extension 4+/5   Right Shoulder ABduction 4/5   Right Shoulder Internal Rotation 4+/5   Right Shoulder External Rotation 4+/5                     [facility] Adult PT Treatment/Exercise - 06/18/15 0810    Self-Care   Self-Care Other  Self-Care Comments   Other Self-Care Comments  to stretch periodically throughout the day to decreased the shoulder muscles from getting tight and creating pain.    Shoulder Exercises: Standing   Other Standing Exercises bicep curls 2 x 10 with 5#, putting weights into cabinet x 5 each of 3,4,and 5#   Other Standing Exercises D1/D2 red theraband 2 x 10 each   Shoulder Exercises: ROM/Strengthening   UBE (Upper Arm Bike) L3 x 10 min  alt direction every 5 min   Shoulder Exercises: Stretch   Other Shoulder Stretches upper trap stretch 2 x 30 sec    Other Shoulder Stretches Levator/ upper trap stretch stretch 2 x 30 sec                PT Education - 06/18/15 0835    Education provided Yes   Education Details HEP Review, updated HEP   Person(s) Educated Patient   Methods Explanation   Comprehension Verbalized understanding          PT Short Term Goals - 06/18/15 0823    PT SHORT TERM GOAL #1   Title pt will be I with basic HEP (06/18/2015)   Time 3  Period Weeks   Status Achieved   PT SHORT TERM GOAL #2   Title pt will demonstrate <3/10 pain in the R shoulder during AROM activity to asisst wtih functional progression (06/18/2015)   Time 3   Period Weeks   Status Achieved   PT SHORT TERM GOAL #3   Title She will increase her AROM by > 15 degrees with R shoulder flexion/abduction to help with ADLs(06/18/2015)   Time 3   Period Weeks   Status Achieved   PT SHORT TERM GOAL #4   Title pt will increase FOTO score by > 10 points to help demonstrate incrased functional capacity (06/18/2015)   Period Weeks   Status Achieved           PT Long Term Goals - 06/18/15 4944    PT LONG TERM GOAL #1   Title upon discharge pt will be I with all HEP given throughout therapy (07/09/2015)   Time 6   Period Weeks   Status Achieved   PT LONG TERM GOAL #2   Title pt will increase R shoulder AROM to [facility] with < 2/10 pain to help with personal hygiene and job related activities (07/09/2015)    Time 6   Period Weeks   Status Achieved   PT LONG TERM GOAL #3   Title pt will demonstrate increased R shoulder strength to > 4/5 in all planes to help with lifting and carrying activities associated with her job (07/09/2015)   Time 6   Period Weeks   Status Achieved   PT LONG TERM GOAL #4   Title She will be able to verbalize and demonstrate techniques to reduce R shoulder reinjury via postural awareness, lifting and carrying mechanics, and HEP (07/09/2015)   Time 6   Period Weeks   Status Achieved   PT LONG TERM GOAL #5   Title pt will increase her FOTO score to > 66 to demonstrate improved funcitonal capacity upon discharge (07/09/2015)   Time 6   Period Weeks   Status Achieved               Plan - 06/18/15 0836    Clinical Impression Statement Nicole Joyce has made great progress with therpay demonstating increased shoulder AROM in all planes and strength. she reports some intermittent tightness in the upper trap with is alleviated following stretching. She met all goals, and increased her FOTO score to 67.  At this point pt is able to maintain and progress her current level of function independently and will be discharged from physical therpay today, and pt agrees.    PT Next Visit Plan D/C   PT Home Exercise Plan pec stretch, bicep stretch, levator stretch, rhomboid stretch, and Deep neck flexor exercise   Consulted and Agree with Plan of Care Patient        Problem List There are no active problems to display for this patient.  Starr Lake PT, DPT, LAT, ATC  06/18/2015  8:41 AM      [facility] [address] [place], [place], [address] Phone: [phone]   Fax:  [phone]     PHYSICAL THERAPY DISCHARGE SUMMARY  Visits from Start of Care: 5  Current functional level related to goals / functional outcomes: FOTO 33% limited   Remaining deficits: Intermittent upper trap tightness   Education /  Equipment: HEP, theraband for strengthening.   Plan: Patient agrees to discharge.  Patient goals were met. Patient is being discharged due to meeting  the stated rehab goals.  ?????

## 2015-06-23 ENCOUNTER — Encounter: Payer: Self-pay | Admitting: Physical Therapy

## 2015-06-29 ENCOUNTER — Encounter: Payer: Self-pay | Admitting: Physical Therapy

## 2015-07-02 ENCOUNTER — Encounter: Payer: Self-pay | Admitting: Physical Therapy

## 2015-07-06 ENCOUNTER — Encounter: Payer: Self-pay | Admitting: Physical Therapy

## 2015-07-09 ENCOUNTER — Encounter: Payer: Self-pay | Admitting: Physical Therapy

## 2015-07-13 ENCOUNTER — Encounter: Payer: Self-pay | Admitting: Physical Therapy

## 2015-07-13 ENCOUNTER — Other Ambulatory Visit: Payer: Self-pay | Admitting: Gastroenterology

## 2015-07-13 LAB — HM COLONOSCOPY

## 2015-07-16 ENCOUNTER — Encounter: Payer: Self-pay | Admitting: Physical Therapy

## 2016-01-05 ENCOUNTER — Emergency Department (HOSPITAL_COMMUNITY): Payer: BLUE CROSS/BLUE SHIELD

## 2016-01-05 ENCOUNTER — Inpatient Hospital Stay (HOSPITAL_COMMUNITY): Payer: BLUE CROSS/BLUE SHIELD

## 2016-01-05 ENCOUNTER — Encounter (HOSPITAL_COMMUNITY): Payer: Self-pay | Admitting: Emergency Medicine

## 2016-01-05 ENCOUNTER — Inpatient Hospital Stay (HOSPITAL_COMMUNITY)
Admission: EM | Admit: 2016-01-05 | Discharge: 2016-01-06 | DRG: 605 | Disposition: A | Discharge status: Home / Self Care | Payer: BLUE CROSS/BLUE SHIELD | Attending: General Surgery | Admitting: General Surgery

## 2016-01-05 DIAGNOSIS — Y93K9 Activity, other involving animal care: Secondary | ICD-10-CM

## 2016-01-05 DIAGNOSIS — W19XXXA Unspecified fall, initial encounter: Secondary | ICD-10-CM | POA: Diagnosis present

## 2016-01-05 DIAGNOSIS — R200 Anesthesia of skin: Secondary | ICD-10-CM | POA: Diagnosis present

## 2016-01-05 DIAGNOSIS — F17210 Nicotine dependence, cigarettes, uncomplicated: Secondary | ICD-10-CM | POA: Diagnosis present

## 2016-01-05 DIAGNOSIS — Y92018 Other place in single-family (private) house as the place of occurrence of the external cause: Secondary | ICD-10-CM

## 2016-01-05 DIAGNOSIS — S01412A Laceration without foreign body of left cheek and temporomandibular area, initial encounter: Secondary | ICD-10-CM | POA: Diagnosis present

## 2016-01-05 DIAGNOSIS — R202 Paresthesia of skin: Secondary | ICD-10-CM

## 2016-01-05 DIAGNOSIS — W0110XA Fall on same level from slipping, tripping and stumbling with subsequent striking against unspecified object, initial encounter: Secondary | ICD-10-CM | POA: Diagnosis present

## 2016-01-05 DIAGNOSIS — R531 Weakness: Secondary | ICD-10-CM | POA: Diagnosis present

## 2016-01-05 DIAGNOSIS — J45909 Unspecified asthma, uncomplicated: Secondary | ICD-10-CM | POA: Diagnosis present

## 2016-01-05 DIAGNOSIS — S1083XA Contusion of other specified part of neck, initial encounter: Secondary | ICD-10-CM | POA: Diagnosis present

## 2016-01-05 DIAGNOSIS — S1093XA Contusion of unspecified part of neck, initial encounter: Secondary | ICD-10-CM

## 2016-01-05 HISTORY — DX: Unspecified fall, initial encounter: W19.XXXA

## 2016-01-05 LAB — CBC WITH DIFFERENTIAL/PLATELET
BASOS PCT: 0 %
Basophils Absolute: 0 10*3/uL (ref 0.0–0.1)
EOS ABS: 0 10*3/uL (ref 0.0–0.7)
Eosinophils Relative: 0 %
HCT: 38.6 % (ref 36.0–46.0)
HEMOGLOBIN: 12.7 g/dL (ref 12.0–15.0)
Lymphocytes Relative: 25 %
Lymphs Abs: 2.2 10*3/uL (ref 0.7–4.0)
MCH: 28.7 pg (ref 26.0–34.0)
MCHC: 32.9 g/dL (ref 30.0–36.0)
MCV: 87.1 fL (ref 78.0–100.0)
Monocytes Absolute: 0.5 10*3/uL (ref 0.1–1.0)
Monocytes Relative: 6 %
NEUTROS PCT: 69 %
Neutro Abs: 6.2 10*3/uL (ref 1.7–7.7)
PLATELETS: 276 10*3/uL (ref 150–400)
RBC: 4.43 MIL/uL (ref 3.87–5.11)
RDW: 14 % (ref 11.5–15.5)
WBC: 9 10*3/uL (ref 4.0–10.5)

## 2016-01-05 LAB — I-STAT CHEM 8, ED
BUN: 11 mg/dL (ref 6–20)
CREATININE: 0.7 mg/dL (ref 0.44–1.00)
Calcium, Ion: 1.19 mmol/L (ref 1.13–1.30)
Chloride: 102 mmol/L (ref 101–111)
Glucose, Bld: 103 mg/dL — ABNORMAL HIGH (ref 65–99)
HEMATOCRIT: 45 % (ref 36.0–46.0)
HEMOGLOBIN: 15.3 g/dL — AB (ref 12.0–15.0)
POTASSIUM: 3.6 mmol/L (ref 3.5–5.1)
Sodium: 143 mmol/L (ref 135–145)
TCO2: 25 mmol/L (ref 0–100)

## 2016-01-05 MED ORDER — OXYCODONE HCL 5 MG PO TABS
5.0000 mg | ORAL_TABLET | ORAL | Status: DC | PRN
  Administered 2016-01-05 – 2016-01-06 (×2): 5 mg via ORAL
  Filled 2016-01-05 (×2): qty 1

## 2016-01-05 MED ORDER — ONDANSETRON HCL 4 MG/2ML IJ SOLN
4.0000 mg | Freq: Once | INTRAMUSCULAR | Status: AC
  Administered 2016-01-05: 4 mg via INTRAVENOUS
  Filled 2016-01-05: qty 2

## 2016-01-05 MED ORDER — MORPHINE SULFATE (PF) 4 MG/ML IV SOLN
4.0000 mg | Freq: Once | INTRAVENOUS | Status: AC
  Administered 2016-01-05: 4 mg via INTRAVENOUS
  Filled 2016-01-05: qty 1

## 2016-01-05 MED ORDER — ACETAMINOPHEN 325 MG PO TABS: 650.0000 mg | ORAL_TABLET | ORAL | Status: DC | PRN

## 2016-01-05 MED ORDER — ONDANSETRON HCL 4 MG/2ML IJ SOLN: 4.0000 mg | Freq: Four times a day (QID) | INTRAMUSCULAR | Status: DC | PRN

## 2016-01-05 MED ORDER — LABETALOL HCL 5 MG/ML IV SOLN
5.0000 mg | Freq: Once | INTRAVENOUS | Status: AC
  Administered 2016-01-05: 5 mg via INTRAVENOUS
  Filled 2016-01-05: qty 4

## 2016-01-05 MED ORDER — ONDANSETRON HCL 4 MG PO TABS: 4.0000 mg | ORAL_TABLET | Freq: Four times a day (QID) | ORAL | Status: DC | PRN

## 2016-01-05 MED ORDER — OXYCODONE HCL 5 MG PO TABS: 10.0000 mg | ORAL_TABLET | ORAL | Status: DC | PRN

## 2016-01-05 MED ORDER — SODIUM CHLORIDE 0.9 % IV SOLN: INTRAVENOUS | Status: DC

## 2016-01-05 MED ORDER — IOHEXOL 350 MG/ML SOLN
50.0000 mL | Freq: Once | INTRAVENOUS | Status: AC | PRN
  Administered 2016-01-05: 50 mL via INTRAVENOUS

## 2016-01-05 MED ORDER — SODIUM CHLORIDE 0.9 % IV SOLN
Freq: Once | INTRAVENOUS | Status: AC
  Administered 2016-01-05: 14:00:00 via INTRAVENOUS

## 2016-01-05 MED ORDER — MORPHINE SULFATE (PF) 2 MG/ML IV SOLN
1.0000 mg | INTRAVENOUS | Status: DC | PRN
  Administered 2016-01-05: 2 mg via INTRAVENOUS
  Filled 2016-01-05: qty 1

## 2016-01-05 NOTE — ED Provider Notes (Signed)
CSN: [id number]     Arrival date & time 01/05/16  9147 History   First MD Initiated Contact with Patient 01/05/16 1117     Chief Complaint  Patient presents with  . Fall     (Consider location/radiation/quality/duration/timing/severity/associated sxs/prior Treatment) HPI Nicole Joyce is a 60 y.o. female with history of asthma, presents to emergency department after a fall. Patient states that she tripped over a coffee table while playing with a puppy in her living room. She fell striking left side of the chin on the coffee table and fell down to the ground hitting left side of the face. She denies loss of consciousness. She states that she had to lay on the floor for "little while." She states that she is having pain in her head since the fall, neck, burning and pain in bilateral hands. She reports a weakness in bilateral hands as well. Patient denies any symptoms in her lower extremities. No difficulty walking. No visual changes. She took another pro time prior to coming in. Her tetanus is up-to-date.  Past Medical History  Diagnosis Date  . Asthma    History reviewed. No pertinent past surgical history. No family history on file. Social History  Substance Use Topics  . Smoking status: Current Every Day Smoker -- 0.50 packs/day    Types: Cigarettes  . Smokeless tobacco: None  . Alcohol Use: No   OB History    No data available     Review of Systems  Constitutional: Negative for fever and chills.  HENT: Positive for facial swelling.   Eyes: Negative for visual disturbance.  Respiratory: Negative for cough, chest tightness and shortness of breath.   Cardiovascular: Negative for chest pain, palpitations and leg swelling.  Gastrointestinal: Negative for nausea, vomiting, abdominal pain and diarrhea.  Genitourinary: Negative for dysuria and flank pain.  Musculoskeletal: Positive for back pain, arthralgias and neck pain. Negative for myalgias and neck stiffness.  Skin: Negative for  rash.  Neurological: Positive for weakness, numbness and headaches. Negative for dizziness.  All other systems reviewed and are negative.     Allergies  Review of patient's allergies indicates no known allergies.  Home Medications   Prior to Admission medications   Medication Sig Start Date End Date Taking? Authorizing Provider  albuterol (PROVENTIL HFA;VENTOLIN HFA) 108 (90 BASE) MCG/ACT inhaler Inhale 2 puffs into the lungs every 6 (six) hours as needed. For shortness of breath    Historical Provider, MD  Aspirin-Salicylamide-Caffeine (BC HEADACHE POWDER PO) Take 1 Package by mouth daily as needed. For head ache    Historical Provider, MD   BP 179/93 mmHg  Pulse 87  Temp(Src) 97.3 F (36.3 C) (Oral)  Resp 18  SpO2 98% Physical Exam  Constitutional: She is oriented to person, place, and time. She appears well-developed and well-nourished. No distress.  HENT:  Head: Normocephalic.  No hemotympanum  Eyes: Conjunctivae and EOM are normal. Pupils are equal, round, and reactive to light.  Neck: Normal range of motion.  Midline cervical spine tenderness. There is significant tenderness over left side of the neck, over sternocleidomastoid muscle. Tender to palpation. No pulsatile masses palpated.  Cardiovascular: Normal rate, regular rhythm and normal heart sounds.   Pulmonary/Chest: Effort normal and breath sounds normal. No respiratory distress. She has no wheezes. She has no rales.  Abdominal: Soft. Bowel sounds are normal. She exhibits no distension. There is no tenderness. There is no rebound.  Musculoskeletal: She exhibits no edema.  No midline thoracic or lumbar spine  tenderness. Difficulty with movement of the hands, however she is able to move all fingers. Normal lower extremity exam.  Neurological: She is alert and oriented to person, place, and time. No cranial nerve deficit. Coordination normal.  Patient with bilateral grip weakness, difficulty with performing them up.  Normal bicep and tricep strength. Reports burning sensation when touched her hand in all dermatomes.  Skin: Skin is warm and dry.  Psychiatric: She has a normal mood and affect. Her behavior is normal.  Nursing note and vitals reviewed.   ED Course  Procedures (including critical care time) Labs Review Labs Reviewed  I-STAT CHEM 8, ED - Abnormal; Notable for the following:    Glucose, Bld 103 (*)    Hemoglobin 15.3 (*)    All other components within normal limits  CBC WITH DIFFERENTIAL/PLATELET    Imaging Review Ct Head Wo Contrast  01/05/2016  CLINICAL DATA:  Tripped and hit chin on table, left frontal headache, dizziness, altered mental status, periorbital bruising on the left, anterior neck laceration, knot and pain left side of neck EXAM: CT HEAD WITHOUT CONTRAST CT MAXILLOFACIAL WITHOUT CONTRAST CT CERVICAL SPINE WITHOUT CONTRAST TECHNIQUE: Multidetector CT imaging of the head, cervical spine, and maxillofacial structures were performed using the standard protocol without intravenous contrast. Multiplanar CT image reconstructions of the cervical spine and maxillofacial structures were also generated. COMPARISON:  None. FINDINGS: CT HEAD FINDINGS No mass lesion. No midline shift. No acute hemorrhage or hematoma. No extra-axial fluid collections. No evidence of acute infarction. No skull fracture. Small periorbital laceration laterally on the left. CT MAXILLOFACIAL FINDINGS As seen on cervical spine CT there is significant enlargement of the left sternocleidomastoid muscle with indistinct borders. The muscle is mildly heterogeneous. There are no facial bone fractures identified. Walls of the orbit are intact. No air-fluid levels in the paranasal sinuses. CT CERVICAL SPINE FINDINGS There is no cervical spine fracture. There is normal alignment. The lung apices are clear. There is degenerative disc disease at C5-6 and C6-7. The left sternocleidomastoid muscle is heterogeneous and significantly  enlarged when compared to the right. There is mild subcutaneous soft tissue increased attenuation overlying the sternocleidomastoid muscle. IMPRESSION: 1.  Small left periorbital laceration 2. Appearance of left sternocleidomastoid muscle suggests intramuscular hematoma. Correlate clinically and consider follow up CT to document resolution in 4 - 6 weeks if this is clinically ambiguous. 3.  No cervical spine fracture Electronically Signed   By: Esperanza Heir M.D.   On: 01/05/2016 13:28   Ct Angio Neck W/cm &/or Wo/cm  01/05/2016  ADDENDUM REPORT: 01/05/2016 13:33 ADDENDUM: Critical Value/emergent results were called by telephone at the time of interpretation on 01/05/2016 at 1317 hours to ED provider Jaynie Crumble , who verbally acknowledged these results. Electronically Signed   By: Odessa Fleming M.D.   On: 01/05/2016 13:33  01/05/2016  CLINICAL DATA:  60 year old female who tripped and fell with subsequent head injury. Left frontal headache, dizziness, abnormal speech. Left side neck pain and palpable abnormality. Initial encounter. EXAM: CT ANGIOGRAPHY NECK TECHNIQUE: Multidetector CT imaging of the neck was performed using the standard protocol during bolus administration of intravenous contrast. Multiplanar CT image reconstructions and MIPs were obtained to evaluate the vascular anatomy. Carotid stenosis measurements (when applicable) are obtained utilizing NASCET criteria, using the distal internal carotid diameter as the denominator. CONTRAST:  50mL OMNIPAQUE IOHEXOL 350 MG/ML SOLN COMPARISON:  Noncontrast CT head, cervical spine, and face from today reported separately. FINDINGS: Aortic arch: Bovine type arch configuration. No  arch atherosclerosis or great vessel origin stenosis. Right carotid system: Negative. Visible right ICA siphon appears normal. Left carotid system: Bovine type left CCA origin. Mildly to moderately tortuous left CCA. The left carotid bifurcation appears within normal limits.  Negative cervical left ICA and visible left ICA siphon. Vertebral arteries: No proximal right subclavian artery stenosis. Normal right vertebral artery origin. Normal right vertebral artery to the vertebrobasilar junction. Normal right PICA origin. No proximal left subclavian artery stenosis. Normal left vertebral artery origin. Tortuous left V1 segment. Normal left vertebral artery to the vertebrobasilar junction. Normal left PICA. Other Vascular Findings: Nonvisualization of the left internal jugular vein which is probably severely compressed by the left neck hematoma described below. Small area of active contrast extravasation along the deep surface of the left sternocleidomastoid muscle at the C4-C5 level on series 4, image 77. The origin of this appears to be a small branch of the left thyrocervical trunk as seen on series 10, image 27. The proximal aspect of that vessel appears normal. Skeleton: Cervical spine findings and facial bone findings reported separately today. Cervicothoracic junction alignment is within normal limits. Visualized thoracic spine and bony thorax appears intact. Visualized paranasal sinuses and mastoids are clear. Other neck: Negative lung apices. No superior mediastinal lymphadenopathy. Thyroid, larynx, pharynx, parapharyngeal spaces, sublingual space, submandibular glands, parotid glands, and orbits soft tissues are within normal limits. There is moderate to severe enlargement and heterogeneity of the left sternocleidomastoid muscle, enlarged up to 3 cm in thickness (versus 1.2 cm on the contralateral left side. There is a small area of active contrast extravasation along the deep surface of the muscle at the C4-C5 level on series 4, image 77. The origin of this appears to be a small branch of the left thyrocervical trunk as seen on series 10, image 27. There is associated trans spatial edema or small volume fluid about the hematoma which tracks cephalad nearly to the level of the SCM  insertion on the mastoid. Trace retropharyngeal fluid or edema at the C4-C5 level. IMPRESSION: 1. Large left neck hematoma primarily within the left sternocleidomastoid muscle with small focus of active contrast extravasation which appears to arise from a small branch of the left thyrocervical trunk as seen on series 10, image 27. 2. No other arterial abnormality in the neck. Nonvisualization of the left internal jugular vein, likely from compression due to the left neck hematoma. 3. Associated left neck transspatial edema. Trace retropharyngeal fluid or edema at C4-C5, which might alternatively indicate anterior cervical spine ligamentous injury. See cervical spine CT from today reported separately. Electronically Signed: By: Odessa FlemingH  Hall M.D. On: 01/05/2016 13:12   Ct Cervical Spine Wo Contrast  01/05/2016  CLINICAL DATA:  Tripped and hit chin on table, left frontal headache, dizziness, altered mental status, periorbital bruising on the left, anterior neck laceration, knot and pain left side of neck EXAM: CT HEAD WITHOUT CONTRAST CT MAXILLOFACIAL WITHOUT CONTRAST CT CERVICAL SPINE WITHOUT CONTRAST TECHNIQUE: Multidetector CT imaging of the head, cervical spine, and maxillofacial structures were performed using the standard protocol without intravenous contrast. Multiplanar CT image reconstructions of the cervical spine and maxillofacial structures were also generated. COMPARISON:  None. FINDINGS: CT HEAD FINDINGS No mass lesion. No midline shift. No acute hemorrhage or hematoma. No extra-axial fluid collections. No evidence of acute infarction. No skull fracture. Small periorbital laceration laterally on the left. CT MAXILLOFACIAL FINDINGS As seen on cervical spine CT there is significant enlargement of the left sternocleidomastoid muscle with indistinct borders.  The muscle is mildly heterogeneous. There are no facial bone fractures identified. Walls of the orbit are intact. No air-fluid levels in the paranasal  sinuses. CT CERVICAL SPINE FINDINGS There is no cervical spine fracture. There is normal alignment. The lung apices are clear. There is degenerative disc disease at C5-6 and C6-7. The left sternocleidomastoid muscle is heterogeneous and significantly enlarged when compared to the right. There is mild subcutaneous soft tissue increased attenuation overlying the sternocleidomastoid muscle. IMPRESSION: 1.  Small left periorbital laceration 2. Appearance of left sternocleidomastoid muscle suggests intramuscular hematoma. Correlate clinically and consider follow up CT to document resolution in 4 - 6 weeks if this is clinically ambiguous. 3.  No cervical spine fracture Electronically Signed   By: Esperanza Heir M.D.   On: 01/05/2016 13:28   Ct Maxillofacial Wo Cm  01/05/2016  CLINICAL DATA:  Tripped and hit chin on table, left frontal headache, dizziness, altered mental status, periorbital bruising on the left, anterior neck laceration, knot and pain left side of neck EXAM: CT HEAD WITHOUT CONTRAST CT MAXILLOFACIAL WITHOUT CONTRAST CT CERVICAL SPINE WITHOUT CONTRAST TECHNIQUE: Multidetector CT imaging of the head, cervical spine, and maxillofacial structures were performed using the standard protocol without intravenous contrast. Multiplanar CT image reconstructions of the cervical spine and maxillofacial structures were also generated. COMPARISON:  None. FINDINGS: CT HEAD FINDINGS No mass lesion. No midline shift. No acute hemorrhage or hematoma. No extra-axial fluid collections. No evidence of acute infarction. No skull fracture. Small periorbital laceration laterally on the left. CT MAXILLOFACIAL FINDINGS As seen on cervical spine CT there is significant enlargement of the left sternocleidomastoid muscle with indistinct borders. The muscle is mildly heterogeneous. There are no facial bone fractures identified. Walls of the orbit are intact. No air-fluid levels in the paranasal sinuses. CT CERVICAL SPINE FINDINGS  There is no cervical spine fracture. There is normal alignment. The lung apices are clear. There is degenerative disc disease at C5-6 and C6-7. The left sternocleidomastoid muscle is heterogeneous and significantly enlarged when compared to the right. There is mild subcutaneous soft tissue increased attenuation overlying the sternocleidomastoid muscle. IMPRESSION: 1.  Small left periorbital laceration 2. Appearance of left sternocleidomastoid muscle suggests intramuscular hematoma. Correlate clinically and consider follow up CT to document resolution in 4 - 6 weeks if this is clinically ambiguous. 3.  No cervical spine fracture Electronically Signed   By: Esperanza Heir M.D.   On: 01/05/2016 13:28   I have personally reviewed and evaluated these images and lab results as part of my medical decision-making.   EKG Interpretation None      MDM   Final diagnoses:  Hematoma of neck, initial encounter  Fall, initial encounter  Paresthesia    11:26 AM Pt seen and examined. Patient with mechanical fall, complaining of a headache, numbness and tingling to both hands, burning sensation to both hands, neck pain. Patient is ambulatory with no difficulty. No visual changes. No loss of consciousness at time of the fall. Patient has significant swelling to the left side of the neck.  unable to rule out vascular injury at this time. Will do CT angiogram of the neck. Will also do CT head, cervical spine, maxillofacial. patient placed in soft c-collar.   1:39 PM CT angio neck showing active extravasation of the thyrocervical trunk. Spoke with vascular who said they will come by and look at the patient. Pt is hypertensive, does not want anything for pain. Will monitor bp closely.   2:05 PM Spoke  with Dr. Darrick Penna, who said he looked over CT and no tx indicated at this time from vascular stand point. Will consult trauma  Trauma saw the patient. Will admit. MR cervical spine pending.   Filed Vitals:    01/05/16 1530 01/05/16 1545 01/05/16 1600 01/05/16 1630  BP: 153/92 138/96 138/95 144/98  Pulse: 75 77 68 62  Temp:      TempSrc:      Resp: SpO2: 96% 98% 99% 98%     Jaynie Crumble, PA-C 01/05/16 1654  Arby Barrette, MD 01/06/16 1955

## 2016-01-05 NOTE — ED Notes (Signed)
Ice pack removed.

## 2016-01-05 NOTE — ED Notes (Signed)
Pt reports that she tripped and fell striking her chin and the left side of her face. Pt has laceration the her chin and reports bilateral hand pain. Pt alert x4. NAD at this time.

## 2016-01-05 NOTE — ED Notes (Signed)
Aspen collar applied with ice on L side of neck
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
## 2016-01-05 NOTE — ED Notes (Signed)
Attempted report 

## 2016-01-05 NOTE — Consult Note (Signed)
[facility] [place] HISTORY AND PHYSICAL   Referring Physician: Shela NevinKirachenko, [place], [place] History of Present Illness:  Patient is a 60 y.o. year old female who presents for evaluation of left neck hematoma.  Pt had a ground level fall earlier today about 5-6 hr ago.   She hit her chin on the corner of a table and her left neck on the side of the table.  She does not recall if she lost consciousness.  She complains of pain in her left neck.  This has not enlarged significantly since arrival in the ER.  She primarily complains of numbness and tingling in her hands.  Other medical problems include asthma which has been stable.  She does take ibuprofen but is not on any antiplatelet or blood thinner.  Past Medical History  Diagnosis Date  . Asthma     History reviewed. No pertinent past surgical history.  Social History Social History  Substance Use Topics  . Smoking status: Current Every Day Smoker -- 0.50 packs/day    Types: Cigarettes  . Smokeless tobacco: None  . Alcohol Use: No    Family History No family history on file.  Allergies  No Known Allergies   Current Facility-Administered Medications  Medication Dose Route Frequency Provider Last Rate Last Dose  . labetalol (NORMODYNE,TRANDATE) injection 5 mg  5 mg Intravenous Once Jaynie Crumbleatyana Kirichenko, PA-C       Current Outpatient Prescriptions  Medication Sig Dispense Refill  . albuterol (PROVENTIL HFA;VENTOLIN HFA) 108 (90 BASE) MCG/ACT inhaler Inhale 2 puffs into the lungs every 6 (six) hours as needed. For shortness of breath    . Aspirin-Salicylamide-Caffeine (BC HEADACHE POWDER PO) Take 1 Package by mouth daily as needed. For head ache    . ibuprofen (ADVIL,MOTRIN) 200 MG tablet Take 200 mg by mouth every 6 (six) hours as needed for mild pain.      ROS:   General:  No weight loss, Fever, chills  HEENT: + recent headaches, no nasal bleeding, no visual changes, no sore throat  Neurologic: +  dizziness, no blackouts, no seizures. No recent symptoms of stroke or mini- stroke. No recent episodes of slurred speech, or temporary blindness.  Cardiac: No recent episodes of chest pain/pressure, no shortness of breath at rest.  No shortness of breath with exertion.  Denies history of atrial fibrillation or irregular heartbeat  Vascular: No history of rest pain in feet.  No history of claudication.  No history of non-healing ulcer, No history of DVT   Pulmonary: No home oxygen, no productive cough, no hemoptysis,  No asthma or wheezing  Musculoskeletal:  [ ]  Arthritis, [ ]  Low back pain,  [ ]  Joint pain  Hematologic:No history of hypercoagulable state.  No history of easy bleeding.  No history of anemia  Gastrointestinal: No hematochezia or melena,  No gastroesophageal reflux, no trouble swallowing  Urinary: [ ]  chronic Kidney disease, [ ]  on HD - [ ]  MWF or [ ]  TTHS, [ ]  Burning with urination, [ ]  Frequent urination, [ ]  Difficulty urinating;   Skin: No rashes  Psychological: No history of anxiety,  No history of depression   Physical Examination  Filed Vitals:   01/05/16 0949 01/05/16 1126 01/05/16 1215 01/05/16 1315  BP:  164/107 151/99 181/97  Pulse: 87 73 71 85  Temp: 97.3 F (36.3 C)     TempSrc: Oral     Resp: 18 21 15 23   SpO2: 98% 98% 95% 96%  There is no height or weight on file to calculate BMI.  General:  Alert and oriented, no acute distress HEENT: Normal Neck: firm left neck mildly tender to palpation, mild asymmetry compared to left side, no bruit or thrill Pulmonary: Clear to auscultation bilaterally Cardiac: Regular Rate and Rhythm without murmur Abdomen: Soft, non-tender, non-distended, no mass Skin: No rash Extremity Pulses:  2+ radial, brachial pulses bilaterally Musculoskeletal: No deformity or edema other than above  Neurologic: Upper and lower extremity motor 5/5 and symmetric, subjectively numbness in both hands  DATA:    CT angio of neck  intramuscular hematoma left sternocleidomastoid with small amount of extrav of contrast from intrmuscular branch  No c spine fracture Degenerative change c5-c7   ASSESSMENT:  Left neck intramuscular hematoma.  Small intramuscular bleeding should stop spontaneously.  No airway compromise no significant change in swelling since pt arrival in ER earlier today   PLAN:  Ice left neck.  Very small muscular branch.  No need for embolization as this should stop spontaneously.  Would only consider draining neck if continued expansion.  Would recommend trauma consult in light of dizziness symptoms and numbness and tingling in hands to see if observation warranted overnight  She can follow up with me in 1 month  Fabienne Bruns, MD [facility] [place] Office: [phone] Pager: [phone]

## 2016-01-05 NOTE — ED Notes (Signed)
Ice pack applied.

## 2016-01-06 LAB — BASIC METABOLIC PANEL
Anion gap: 10 (ref 5–15)
BUN: 7 mg/dL (ref 6–20)
CALCIUM: 9.1 mg/dL (ref 8.9–10.3)
CO2: 25 mmol/L (ref 22–32)
CREATININE: 0.74 mg/dL (ref 0.44–1.00)
Chloride: 104 mmol/L (ref 101–111)
GFR calc non Af Amer: >60 mL/min (ref >60)
Glucose, Bld: 98 mg/dL (ref 65–99)
Potassium: 3.6 mmol/L (ref 3.5–5.1)
SODIUM: 139 mmol/L (ref 135–145)

## 2016-01-06 LAB — CBC
HCT: 38.3 % (ref 36.0–46.0)
Hemoglobin: 12.1 g/dL (ref 12.0–15.0)
MCH: 27.7 pg (ref 26.0–34.0)
MCHC: 31.6 g/dL (ref 30.0–36.0)
MCV: 87.6 fL (ref 78.0–100.0)
PLATELETS: 252 10*3/uL (ref 150–400)
RBC: 4.37 MIL/uL (ref 3.87–5.11)
RDW: 14 % (ref 11.5–15.5)
WBC: 6.7 10*3/uL (ref 4.0–10.5)

## 2016-01-06 MED ORDER — OXYCODONE HCL 5 MG PO TABS: 5.0000 mg | ORAL_TABLET | ORAL | Status: DC | PRN

## 2016-01-06 NOTE — Progress Notes (Signed)
Discharge home. Home discharge instruction given, no question verbalized. 

## 2016-01-06 NOTE — Progress Notes (Signed)
[facility]   Subjective  - States her neck is feeling a little better.  CC is bilateral median nerve tingling and pain to the touch thumb to first digit.   Objective 129/80 67 98 F (36.7 C) (Oral) 17 98%  Intake/Output Summary (Last 24 hours) at 01/06/16 [id number] Last data filed at 01/06/16 0427  Gross per 24 hour  Intake    220 ml  Output      0 ml  Net    220 ml    Grip 4+/5 bilaterally, sensation decreased median nerve bilaterally Left neck swelling in the sternocleidomastoid muscle from ear lobe to sternum with out increased size from yesterday's exam   Assessment/Planning: POD # 1 trauma of falling hitting left side of neck Swollen sternocleidomastoid muscle due to intramuscular hematoma  Ice 15 min multiple times per day PRN comfort We will arrange f/u in our office in 4 weeks.  Clinton GallantCOLLINS, Jodi Kappes [facility] CityMAUREEN 01/06/2016 8:07 AM --  Laboratory Lab Results:  Recent Labs  01/05/16 1135 01/05/16 1142 01/06/16 0420  WBC 9.0  --  6.7  HGB 12.7 15.3* 12.1  HCT 38.6 45.0 38.3  PLT 276  --  252   BMET  Recent Labs  01/05/16 1142 01/06/16 0420  NA 143 139  K 3.6 3.6  CL 102 104  CO2  --  25  GLUCOSE 103* 98  BUN 11 7  CREATININE 0.70 0.74  CALCIUM  --  9.1    COAG No results found for: INR, PROTIME No results found for: PTT

## 2016-01-06 NOTE — Discharge Summary (Signed)
Physician Discharge Summary  Patient ID: Nicole PieriniCarolyn Horiuchi MRN: [id number] DOB/AGE: 04/27/1956 60 y.o.  Admit date: 01/05/2016 Discharge date: 01/06/2016  Admission Diagnoses:L neck hematoma after fall, paresthesias B hands  Discharge Diagnoses: same Active Problems:   Fall   Discharged Condition: good  Hospital Course: Eber JonesCarolyn was admitted after a fall when she struck her L neck on a table. She had a L neck hematoma with tiny extrav. She was seen by Dr. Darrick PennaFields from [facility] who rec observation. She also had tingling in B hands but no weakness. MR C spine was negative. Her hematoma remained stable, she tolerated PO and is D/C in stable condition.  Consults: vascular surgery  Significant Diagnostic Studies: CT, MR  Treatments: analgesia  Discharge Exam: Blood pressure 129/80, pulse 67, temperature 98 F (36.7 C), temperature source Oral, resp. rate 17, height 5' 0.4" (1.534 m), weight 76.3 kg (168 lb 3.4 oz), SpO2 98 %. General appearance: cooperative Neck: L neck hematoma stable Resp: clear to auscultation bilaterally Cardio: regular rate and rhythm GI: soft, NT, ND  Neuro: MAE, speech clear  Disposition: 01-Home or Self Care  Discharge Instructions    Diet - low sodium heart healthy    Complete by:  As directed      Discharge instructions    Complete by:  As directed   Apply ice to L neck for comfort     Increase activity slowly    Complete by:  As directed             Medication List    TAKE these medications        albuterol 108 (90 Base) MCG/ACT inhaler  Commonly known as:  PROVENTIL HFA;VENTOLIN HFA  Inhale 2 puffs into the lungs every 6 (six) hours as needed. For shortness of breath     BC HEADACHE POWDER PO  Take 1 Package by mouth daily as needed. For head ache     ibuprofen 200 MG tablet  Commonly known as:  ADVIL,MOTRIN  Take 200 mg by mouth every 6 (six) hours as needed for mild pain.     oxyCODONE 5 MG immediate release tablet  Commonly known as:  Oxy  IR/ROXICODONE  Take 1 tablet (5 mg total) by mouth every 4 (four) hours as needed for moderate pain.           Follow-up Information    Follow up with Fabienne BrunsFields, Charles, MD In 4 weeks.   Specialties:  Vascular Surgery, Cardiology   Why:  Office will call you to arrange your appt (sent)   Contact information:   [address] [place] [address] [phone]       Follow up with [facility]. Go on 01/20/2016.   Why:  For post-hospital follow up with the trauma service.  Your appointment is on 01/20/16 at 2:00pm, please arrive by 1:30pm to check in and fill out paperwork.   Contact information:   [address] [place] [phone]      Signed: Liz MaladyHOMPSON,Aundrea Horace E 01/06/2016, 9:42 AM

## 2016-01-06 NOTE — Discharge Instructions (Addendum)
Hematoma A hematoma is a collection of blood under the skin, in an organ, in a body space, in a joint space, or in other tissue. The blood can clot to form a lump that you can see and feel. The lump is often firm and may sometimes become sore and tender. Most hematomas get better in a few days to weeks. However, some hematomas may be serious and require medical care. Hematomas can range in size from very small to very large. CAUSES  A hematoma can be caused by a blunt or penetrating injury. It can also be caused by spontaneous leakage from a blood vessel under the skin. Spontaneous leakage from a blood vessel is more likely to occur in older people, especially those taking blood thinners. Sometimes, a hematoma can develop after certain medical procedures. SIGNS AND SYMPTOMS   A firm lump on the body.  Possible pain and tenderness in the area.  Bruising.Blue, dark blue, purple-red, or yellowish skin may appear at the site of the hematoma if the hematoma is close to the surface of the skin. For hematomas in deeper tissues or body spaces, the signs and symptoms may be subtle. For example, an intra-abdominal hematoma may cause abdominal pain, weakness, fainting, and shortness of breath. An intracranial hematoma may cause a headache or symptoms such as weakness, trouble speaking, or a change in consciousness. DIAGNOSIS  A hematoma can usually be diagnosed based on your medical history and a physical exam. Imaging tests may be needed if your health care provider suspects a hematoma in deeper tissues or body spaces, such as the abdomen, head, or chest. These tests may include ultrasonography or a CT scan.  TREATMENT  Hematomas usually go away on their own over time. Rarely does the blood need to be drained out of the body. Large hematomas or those that may affect vital organs will sometimes need surgical drainage or monitoring. HOME CARE INSTRUCTIONS   Apply ice to the injured area:   Put ice in a  plastic bag.   Place a towel between your skin and the bag.   Leave the ice on for 20 minutes, 2-3 times a day for the first 1 to 2 days.   After the first 2 days, switch to using warm compresses on the hematoma.   Elevate the injured area to help decrease pain and swelling. Wrapping the area with an elastic bandage may also be helpful. Compression helps to reduce swelling and promotes shrinking of the hematoma. Make sure the bandage is not wrapped too tight.   If your hematoma is on a lower extremity and is painful, crutches may be helpful for a couple days.   Only take over-the-counter or prescription medicines as directed by your health care provider. SEEK IMMEDIATE MEDICAL CARE IF:   You have increasing pain, or your pain is not controlled with medicine.   You have a fever.   You have worsening swelling or discoloration.   Your skin over the hematoma breaks or starts bleeding.   Your hematoma is in your chest or abdomen and you have weakness, shortness of breath, or a change in consciousness.  Your hematoma is on your scalp (caused by a fall or injury) and you have a worsening headache or a change in alertness or consciousness. MAKE SURE YOU:   Understand these instructions.  Will watch your condition.  Will get help right away if you are not doing well or get worse.   This information is not intended to replace  advice given to you by your health care provider. Make sure you discuss any questions you have with your health care provider.   Document Released: 05/17/2004 Document Revised: 06/05/2013 Document Reviewed: 03/13/2013 Elsevier Interactive Patient Education 2016 [employer].  Burner or Stinger Nerve Injury A burner or stinger is an injury to the brachial plexus that occurs from a trauma to the neck or shoulder. The brachial plexus is a set of nerves that run from your neck to your shoulder, arm, hand, and fingers. These nerves allow movement and feeling  in these areas of the body. When the nerves are stretched or compressed, this can cause pain, numbness, and weakness in the arm.  Burners or stingers are common in contact sports such as football or wrestling. In many cases, the pain and weakness last for only a few minutes after the injury, and no further problems arise. However, the symptoms can also last for a few days or weeks depending on the severity of the injury. CAUSES  These injuries are usually caused by direct trauma to the head, neck, or shoulder. This type of trauma often occurs during a collision or fall in contact sports such as football. RISK FACTORS  Participating in contact sports.  Having poor strength and flexibility.  Having had a previous burner or stinger injury.  Wearing ill-fitting shoulder pads during contact sports.  Having a small spinal canal in the neck. SIGNS AND SYMPTOMS  Symptoms often last for just a brief period after the injury, but they can also last for a few days or weeks. Usually, only one arm is affected. Common symptoms include:  Burning or stinging pain that starts in the shoulder and shoots into the arm.   Numbness, weakness, or brief paralysis of the shoulder, arm, or hand.   A tingling orpins andneedles feeling that runs down the arm.  Decrease in function of the shoulder, arm, or hand. DIAGNOSIS  Your health care provider can usually diagnose this condition by doing a physical exam, evaluating your symptoms, and asking about the details of the injury. Tests are sometimes done to check the severity of the injury or to look for other problems. Tests may include:  Imaging tests such as X-rays or an MRI.  Electromyography or nerve conduction tests. These tests measure how well your nerves are working. TREATMENT  Burners or stingers often do not require any treatment. The symptoms usually go away in a short time. When needed, treatment options may include:  Applying ice to the injured  area.   Taking medicines for pain or inflammation.   Doing physical therapy to maintain strength and flexibility.   Using a splint or sling.  HOME CARE INSTRUCTIONS   Rest as directed by your health care provider. Do not participate in sports or perform the activity that caused the injury until your symptoms go away and your health care provider approves.  Apply ice to the injured area.  Put ice in a plastic bag.  Place a towel between your skin and the bag.   Leave the ice on for 20 minutes, 2-3 times per day.  Take medicines only as directed by your health care provider.  If you were given a splint or sling, wear it as instructed. You may remove it to shower.   Perform strength and range-of-motion exercises as directed by your health care provider or physical therapist.  Keep all follow-up visits as directed by your health care provider. This is important. PREVENTION  Taking these steps  can help prevent another burner or stinger injury:  Perform exercises to keep your neck and shoulder muscles strong and flexible.  Wear proper protective equipment for your neck and shoulder when playing sports.   Use proper technique when performing actions in contact sports.  Stretch your neck and shoulder muscles before participating in sports.  SEEK MEDICAL CARE IF:  Your symptoms do not improve within 1 week.   Your symptoms get worse.  You have symptoms in both arms. SEEK IMMEDIATE MEDICAL CARE IF:   You develop severe neck pain.  You lose control of your urine or stool.  You develop new or increased weakness in your arms or legs.   This information is not intended to replace advice given to you by your health care provider. Make sure you discuss any questions you have with your health care provider.   Document Released: 12/24/2003 Document Revised: 10/24/2014 Document Reviewed: 06/05/2014 Elsevier Interactive Patient Education [employer] Elsevier Inc.

## 2016-01-07 ENCOUNTER — Telehealth: Payer: Self-pay | Admitting: Vascular Surgery

## 2016-01-07 NOTE — Telephone Encounter (Signed)
-----   Message from Sharee PimpleMarilyn K McChesney, RN sent at 01/06/2016 10:47 AM EDT ----- Regarding: schedule   ----- Message -----    From: Lars MageEmma M Collins, PA-C    Sent: 01/06/2016   8:12 AM      To: [facility] Charge Pool  F/U in 4 weeks with Dr. Darrick PennaFields s/p fall with hematoma left neck

## 2016-01-27 ENCOUNTER — Encounter: Payer: Self-pay | Admitting: Vascular Surgery

## 2016-02-04 ENCOUNTER — Ambulatory Visit (INDEPENDENT_AMBULATORY_CARE_PROVIDER_SITE_OTHER): Payer: BLUE CROSS/BLUE SHIELD | Admitting: Vascular Surgery

## 2016-02-04 VITALS — BP 126/80 | HR 88 | Temp 98.0°F | Resp 14 | Ht 64.5 in | Wt 165.0 lb

## 2016-02-04 DIAGNOSIS — S1093XD Contusion of unspecified part of neck, subsequent encounter: Secondary | ICD-10-CM

## 2016-02-04 NOTE — Progress Notes (Signed)
Patient is a 60 year old female who returns for follow-up today after avulsion of a small external carotid artery branch which resulted in a left neck hematoma. This happened after falling and hitting her head on the side of the table. She reports no pain in her neck. She thinks the hematoma is getting smaller. She denies any symptoms of TIA amaurosis or stroke.  Physical exam:  Filed Vitals:   02/04/16 1318  BP: 126/80  Pulse: 88  Temp: 98 F (36.7 C)  TempSrc: Oral  Resp: 14  Height: 5' 4.5" (1.638 m)  Weight: 165 lb (74.844 kg)  SpO2: 99%    Neck: No carotid bruits 3 cm mass mid neck adjacent to the sternocleidomastoid muscle no bruit no thrill  Assessment: Resolving hematoma left neck  Plan: Patient will be scheduled for a follow-up carotid duplex scan in 6 months and an office visit with me. However, if her neck has completely return to normal she has no mass and has no symptoms she will not necessarily need to return for follow-up appointment.  Nicole Brunsharles Lucielle Vokes, MD [facility] [place] Office: [phone] Pager: [phone]

## 2016-03-11 NOTE — Addendum Note (Signed)
Addended by: Sharee PimpleMCCHESNEY, Lucrezia Dehne K on: 03/11/2016 01:24 PM   Modules accepted: Orders

## 2016-04-02 ENCOUNTER — Ambulatory Visit (HOSPITAL_COMMUNITY)
Admission: EM | Admit: 2016-04-02 | Discharge: 2016-04-02 | Disposition: A | Discharge status: Home / Self Care | Payer: BLUE CROSS/BLUE SHIELD | Attending: Family Medicine | Admitting: Family Medicine

## 2016-04-02 ENCOUNTER — Ambulatory Visit (INDEPENDENT_AMBULATORY_CARE_PROVIDER_SITE_OTHER): Payer: BLUE CROSS/BLUE SHIELD

## 2016-04-02 ENCOUNTER — Encounter (HOSPITAL_COMMUNITY): Payer: Self-pay | Admitting: *Deleted

## 2016-04-02 DIAGNOSIS — S86812A Strain of other muscle(s) and tendon(s) at lower leg level, left leg, initial encounter: Secondary | ICD-10-CM

## 2016-04-02 DIAGNOSIS — S86912A Strain of unspecified muscle(s) and tendon(s) at lower leg level, left leg, initial encounter: Secondary | ICD-10-CM

## 2016-04-02 MED ORDER — HYDROCODONE-ACETAMINOPHEN 5-325 MG PO TABS
1.0000 | ORAL_TABLET | Freq: Four times a day (QID) | ORAL | Status: DC | PRN
Start: 2016-04-02 — End: 2020-02-03

## 2016-04-02 NOTE — Discharge Instructions (Signed)
Wear knee splint and use crutches as needed, call on mon to see orthopedist when possible for knee recheck. X-ray as noted.

## 2016-04-02 NOTE — ED Notes (Signed)
Pt reports   She  Nicole Joyce      Today  And  Injured  Her  l  Knee  She  Reports  Pain on  Weight  Bearing  Some  Swelling  Noted  As   Well

## 2016-04-02 NOTE — ED Provider Notes (Addendum)
CSN: [id number]     Arrival date & time 04/02/16  1926 History   First MD Initiated Contact with Patient 04/02/16 2104     Chief Complaint  Patient presents with  . Leg Injury   (Consider location/radiation/quality/duration/timing/severity/associated sxs/prior Treatment) Patient is a 60 y.o. female presenting with knee pain. The history is provided by the patient.  Knee Pain Location:  Knee Injury: yes   Mechanism of injury: fall   Mechanism of injury comment:  Coming out of [employer] tonight and twisted left knee Fall:    Fall occurred:  Down stairs   Point of impact:  Knees Knee location:  L knee Pain details:    Duration:  2 hours Chronicity:  New Dislocation: no   Prior injury to area:  No Relieved by:  Elevation Associated symptoms: decreased ROM and swelling     Past Medical History  Diagnosis Date  . Asthma    History reviewed. No pertinent past surgical history. History reviewed. No pertinent family history. Social History  Substance Use Topics  . Smoking status: Current Every Day Smoker -- 0.50 packs/day    Types: Cigarettes  . Smokeless tobacco: None  . Alcohol Use: No   OB History    No data available     Review of Systems  Constitutional: Negative.   Musculoskeletal: Positive for joint swelling and gait problem.  Skin: Negative.   All other systems reviewed and are negative.   Allergies  Review of patient's allergies indicates no known allergies.  Home Medications   Prior to Admission medications   Medication Sig Start Date End Date Taking? Authorizing Provider  albuterol (PROVENTIL HFA;VENTOLIN HFA) 108 (90 BASE) MCG/ACT inhaler Inhale 2 puffs into the lungs every 6 (six) hours as needed. For shortness of breath    Historical Provider, MD  Aspirin-Salicylamide-Caffeine (BC HEADACHE POWDER PO) Take 1 Package by mouth daily as needed. For head ache    Historical Provider, MD  HYDROcodone-acetaminophen (NORCO/VICODIN) 5-325 MG tablet Take 1 tablet  by mouth every 6 (six) hours as needed. For pain 04/02/16   Linna Hoff, MD  ibuprofen (ADVIL,MOTRIN) 200 MG tablet Take 200 mg by mouth every 6 (six) hours as needed for mild pain.    Historical Provider, MD  oxyCODONE (OXY IR/ROXICODONE) 5 MG immediate release tablet Take 1 tablet (5 mg total) by mouth every 4 (four) hours as needed for moderate pain. 01/06/16   Violeta Gelinas, MD   Meds Ordered and Administered this Visit  Medications - No data to display  BP 136/86 mmHg  Pulse 88  Temp(Src) 98.4 F (36.9 C) (Oral)  SpO2 98% No data found.   Physical Exam  Constitutional: She is oriented to person, place, and time. She appears well-developed and well-nourished.  Musculoskeletal: She exhibits tenderness.       Left knee: She exhibits decreased range of motion, swelling and effusion. She exhibits no ecchymosis, no deformity, no laceration, no erythema, normal alignment and normal patellar mobility. Tenderness found. Patellar tendon tenderness noted.  Neurological: She is alert and oriented to person, place, and time.  Skin: Skin is warm and dry.  Nursing note and vitals reviewed.   ED Course  Procedures (including critical care time)  Labs Review Labs Reviewed - No data to display  Imaging Review No results found.   Visual Acuity Review  Right Eye Distance:   Left Eye Distance:   Bilateral Distance:    Right Eye Near:   Left Eye Near:  Bilateral Near:         MDM   1. Strain of knee, left, initial encounter        Linna HoffJames D Jandi Swiger, MD 04/03/16 [id number]  Linna HoffJames D Dmetrius Ambs, MD 04/28/16 1209

## 2016-07-19 ENCOUNTER — Other Ambulatory Visit: Payer: Self-pay | Admitting: Family Medicine

## 2016-07-19 ENCOUNTER — Other Ambulatory Visit (HOSPITAL_COMMUNITY)
Admission: RE | Admit: 2016-07-19 | Discharge: 2016-07-19 | Disposition: A | Discharge status: Home / Self Care | Payer: BLUE CROSS/BLUE SHIELD | Source: Ambulatory Visit | Attending: Family Medicine | Admitting: Family Medicine

## 2016-07-19 DIAGNOSIS — Z1151 Encounter for screening for human papillomavirus (HPV): Secondary | ICD-10-CM | POA: Insufficient documentation

## 2016-07-19 DIAGNOSIS — Z01419 Encounter for gynecological examination (general) (routine) without abnormal findings: Secondary | ICD-10-CM | POA: Diagnosis present

## 2016-07-21 LAB — CYTOLOGY - PAP

## 2016-07-25 LAB — HM PAP SMEAR

## 2016-08-09 ENCOUNTER — Encounter: Payer: Self-pay | Admitting: Vascular Surgery

## 2016-08-11 ENCOUNTER — Ambulatory Visit: Payer: BLUE CROSS/BLUE SHIELD | Admitting: Vascular Surgery

## 2016-08-11 ENCOUNTER — Encounter (HOSPITAL_COMMUNITY): Payer: BLUE CROSS/BLUE SHIELD

## 2016-08-17 ENCOUNTER — Ambulatory Visit (HOSPITAL_COMMUNITY)
Admission: RE | Admit: 2016-08-17 | Discharge: 2016-08-17 | Disposition: A | Discharge status: Home / Self Care | Payer: BLUE CROSS/BLUE SHIELD | Source: Ambulatory Visit | Attending: Vascular Surgery | Admitting: Vascular Surgery

## 2016-08-17 DIAGNOSIS — S1093XD Contusion of unspecified part of neck, subsequent encounter: Secondary | ICD-10-CM | POA: Diagnosis present

## 2016-08-17 DIAGNOSIS — X58XXXD Exposure to other specified factors, subsequent encounter: Secondary | ICD-10-CM | POA: Diagnosis not present

## 2016-08-17 LAB — VAS US CAROTID
LCCAPSYS: 82 cm/s
LEFT ECA DIAS: -12 cm/s
LEFT VERTEBRAL DIAS: 10 cm/s
Left CCA dist dias: -33 cm/s
Left CCA dist sys: -107 cm/s
Left CCA prox dias: 15 cm/s
Left ICA dist dias: -39 cm/s
Left ICA dist sys: -95 cm/s
Left ICA prox dias: 21 cm/s
Left ICA prox sys: 97 cm/s
RCCADSYS: -83 cm/s
RCCAPDIAS: 16 cm/s
RIGHT CCA MID DIAS: -22 cm/s
RIGHT ECA DIAS: -17 cm/s
RIGHT VERTEBRAL DIAS: 19 cm/s
Right CCA prox sys: 77 cm/s

## 2016-08-23 ENCOUNTER — Encounter: Payer: Self-pay | Admitting: Vascular Surgery

## 2016-08-24 ENCOUNTER — Encounter: Payer: Self-pay | Admitting: Vascular Surgery

## 2016-08-24 ENCOUNTER — Ambulatory Visit (INDEPENDENT_AMBULATORY_CARE_PROVIDER_SITE_OTHER): Payer: BLUE CROSS/BLUE SHIELD | Admitting: Vascular Surgery

## 2016-08-24 VITALS — BP 152/100 | HR 70 | Temp 97.7°F | Resp 20 | Ht 64.5 in | Wt 168.0 lb

## 2016-08-24 DIAGNOSIS — S1093XD Contusion of unspecified part of neck, subsequent encounter: Secondary | ICD-10-CM | POA: Diagnosis not present

## 2016-08-24 DIAGNOSIS — Z720 Tobacco use: Secondary | ICD-10-CM

## 2016-08-24 NOTE — Progress Notes (Signed)
Patient is a 60 year old female who returns for follow-up today after avulsion of a small external carotid artery branch which resulted in a left neck hematoma. This happened after falling and hitting her head on the side of the table. This was April 2017. She still has some occasional pain in the left side of her neck. The swelling has completely resolved. She denies any symptoms of stroke TIA or amaurosis.  Review of systems: She denies shortness of breath. She denies chest pain.    Physical exam:   Vitals:   08/24/16 0957 08/24/16 0958  BP: (!) 149/94 (!) 152/100  Pulse: 70   Resp: 20   Temp: 97.7 F (36.5 C)   TempSrc: Oral   SpO2: 98%   Weight: 168 lb (76.2 kg)   Height: 5' 4.5" (1.638 m)     Neck: No carotid bruits no swelling left neck  Data: Patient had a carotid duplex scan in our office on 08/17/2016. This showed less than 40% stenosis bilaterally. There is no evidence of any other pathology such as hematoma fluid collections she did have some atherosclerotic change.   Assessment: Resolving hematoma left neck.  Mild atherosclerosis carotid arteries discussed with patient smoking cessation greater than 3 minutes today. Hypertension under the care of her primary care physician.   Plan: Patient will follow-up on as-needed basis.    Fabienne Brunsharles Fields, MD [facility] Office: [phone] Pager: [phone]

## 2016-11-11 LAB — HM MAMMOGRAPHY

## 2018-09-03 ENCOUNTER — Ambulatory Visit: Payer: Self-pay | Admitting: Family Medicine

## 2018-09-03 VITALS — BP 120/75 | HR 83 | Temp 98.4°F | Resp 18 | Ht 65.5 in | Wt 158.6 lb

## 2018-09-03 DIAGNOSIS — Z0289 Encounter for other administrative examinations: Secondary | ICD-10-CM

## 2018-09-03 NOTE — Progress Notes (Signed)
Nicole PieriniCarolyn Joyce is a 62 y.o. 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 She report she is overall healthy and recently experienced an upper respiratory infection. She has a known history of asthma.  Review of Systems  Constitutional: Negative for chills, fever and malaise/fatigue.  HENT: Negative for congestion, ear discharge, ear pain, sinus pain and sore throat.   Eyes: Negative.   Respiratory: Negative for cough, sputum production and shortness of breath.   Cardiovascular: Negative.  Negative for chest pain.  Gastrointestinal: Negative for abdominal pain, diarrhea, nausea and vomiting.  Genitourinary: Negative for dysuria, frequency, hematuria and urgency.  Musculoskeletal: Negative for myalgias.  Skin: Negative.   Neurological: Negative for headaches.  Endo/Heme/Allergies: Negative.   Psychiatric/Behavioral: Negative.     O: Vitals:   09/03/18 1711  BP: 120/75  Pulse: 83  Resp: 18  Temp: 98.4 F (36.9 C)  SpO2: 98%     Physical Exam  Constitutional: She is oriented to person, place, and time. Vital signs are normal. She appears well-developed and well-nourished. She is active.  Non-toxic appearance. She does not have a sickly appearance.  HENT:  Head: Normocephalic.  Right Ear: Hearing, tympanic membrane, external ear and ear canal normal.  Left Ear: Hearing, tympanic membrane, external ear and ear canal normal.  Nose: Nose normal.  Mouth/Throat: Uvula is midline and oropharynx is clear and moist.  Neck: Normal range of motion. Neck supple.  Cardiovascular: Normal rate, regular rhythm, normal heart sounds and normal pulses.  Pulmonary/Chest: Effort normal. She has wheezes in the right lower field and the left lower field.  Abdominal: Soft. Bowel sounds are normal.  Musculoskeletal: Normal range of motion.  Lymphadenopathy:       Head (right side): No submental and no submandibular adenopathy present.       Head (left  side): No submental and no submandibular adenopathy present.    She has no cervical adenopathy.  Neurological: She is alert and oriented to person, place, and time.  Psychiatric: She has a normal mood and affect.  Vitals reviewed.  A: 1. Encounter for physical examination related to employment     P: Exam findings, diagnosis etiology and medication use and indications reviewed with patient. Follow- Up and discharge instructions provided. No emergent/urgent issues found on exam.  Patient verbalized understanding of information provided and agrees with plan of care (POC), all questions answered.  1. Encounter for physical examination related to employment WNL- normal physical exam

## 2019-03-10 ENCOUNTER — Encounter (HOSPITAL_COMMUNITY): Payer: Self-pay | Admitting: *Deleted

## 2019-03-10 ENCOUNTER — Other Ambulatory Visit: Payer: Self-pay

## 2019-03-10 ENCOUNTER — Emergency Department (HOSPITAL_COMMUNITY)
Admission: EM | Admit: 2019-03-10 | Discharge: 2019-03-10 | Disposition: A | Discharge status: Home / Self Care | Payer: BLUE CROSS/BLUE SHIELD | Attending: Emergency Medicine | Admitting: Emergency Medicine

## 2019-03-10 DIAGNOSIS — F17210 Nicotine dependence, cigarettes, uncomplicated: Secondary | ICD-10-CM | POA: Diagnosis not present

## 2019-03-10 DIAGNOSIS — J45909 Unspecified asthma, uncomplicated: Secondary | ICD-10-CM | POA: Insufficient documentation

## 2019-03-10 DIAGNOSIS — Z79899 Other long term (current) drug therapy: Secondary | ICD-10-CM | POA: Diagnosis not present

## 2019-03-10 DIAGNOSIS — B029 Zoster without complications: Secondary | ICD-10-CM

## 2019-03-10 DIAGNOSIS — R21 Rash and other nonspecific skin eruption: Secondary | ICD-10-CM | POA: Diagnosis present

## 2019-03-10 MED ORDER — VALACYCLOVIR HCL 1 G PO TABS: 1000.0000 mg | ORAL_TABLET | Freq: Three times a day (TID) | ORAL | 0 refills | Status: AC

## 2019-03-10 NOTE — ED Triage Notes (Signed)
Pt reports she has shingles on her Lt upperleg. Pt has red painful rash since Monday.

## 2019-03-10 NOTE — ED Provider Notes (Addendum)
[facility] EMERGENCY DEPARTMENT Provider Note   CSN: [id number] Arrival date & time: 03/10/19  1039    History   Chief Complaint Chief Complaint  Patient presents with  . Herpes Zoster    HPI Nicole Joyce is a 63 y.o. female present day for painful rash.  Patient reports that rash first began 6 days ago overlying her left glute and has since continue to extend across her body and down her left leg.  Patient describes a burning sensation constant moderate intensity worsened with palpation and alleviated with topical cream from the store, she cannot recall the name of this medication.  Patient reports that initial lesions over her left gluteal have produced pus and subsequently crusted over however she is having new lesions to her anterior left thigh that have arisen yesterday which have not crusted over.  Patient reports that she has never had a rash like this before.  She denies any immunocompromising diseases and denies chronic steroid use.  She reports her only medical history is asthma which is well controlled with inhaler.  Patient denies any ophthalmic or otic involvement, she denies any pain with bowel movements or with urination denies any lesions to her gluteal cleft or genitals.  She denies fever, chills, chest pain, shortness of breath, numbness/weakness or any additional concerns.     HPI  Past Medical History:  Diagnosis Date  . Asthma     Patient Active Problem List   Diagnosis Date Noted  . Fall 01/05/2016    History reviewed. No pertinent surgical history.   OB History   No obstetric history on file.      Home Medications    Prior to Admission medications   Medication Sig Start Date End Date Taking? Authorizing Provider  albuterol (PROVENTIL HFA;VENTOLIN HFA) 108 (90 BASE) MCG/ACT inhaler Inhale 2 puffs into the lungs every 6 (six) hours as needed. For shortness of breath    [provider]  Aspirin-Salicylamide-Caffeine (BC  HEADACHE POWDER PO) Take 1 Package by mouth daily as needed. For head ache    [provider]  HYDROcodone-acetaminophen (NORCO/VICODIN) 5-325 MG tablet Take 1 tablet by mouth every 6 (six) hours as needed. For pain Patient not taking: Reported on 08/24/2016 04/02/16   Linna Hoff, MD  ibuprofen (ADVIL,MOTRIN) 200 MG tablet Take 200 mg by mouth every 6 (six) hours as needed for mild pain.    [provider]  naproxen (NAPROSYN) 500 MG tablet TAKE 1 TABLET TWICE A DAY AS NEEDED WITH FOOD 05/24/16   [provider]  oxyCODONE (OXY IR/ROXICODONE) 5 MG immediate release tablet Take 1 tablet (5 mg total) by mouth every 4 (four) hours as needed for moderate pain. Patient not taking: Reported on 08/24/2016 01/06/16   Violeta Gelinas, MD  valACYclovir (VALTREX) 1000 MG tablet Take 1 tablet (1,000 mg total) by mouth 3 (three) times daily for 7 days. 03/10/19 03/17/19  Bill Salinas, PA-C    Family History History reviewed. No pertinent family history.  Social History Social History   Tobacco Use  . Smoking status: Current Every Day Smoker    Packs/day: 0.50    Types: Cigarettes  . Smokeless tobacco: Never Used  Substance Use Topics  . Alcohol use: No  . Drug use: No     Allergies   Patient has no known allergies.   Review of Systems Review of Systems  Constitutional: Negative.  Negative for chills and fever.  HENT: Negative.  Negative for facial  swelling, hearing loss and sore throat.   Eyes: Negative.  Negative for pain and visual disturbance.  Respiratory: Negative.  Negative for shortness of breath.   Cardiovascular: Negative.  Negative for chest pain.  Gastrointestinal: Negative.  Negative for abdominal pain, nausea and vomiting.  Genitourinary: Negative.  Negative for dysuria and hematuria.  Skin: Positive for rash.  Neurological: Negative.  Negative for weakness, numbness and headaches.   Physical Exam Updated Vital Signs BP (!) 123/95 (BP  Location: Right Arm)   Pulse 89   Temp 98.6 F (37 C) (Oral)   Resp 16   Ht 5\' 4"  (1.626 m)   Wt 71.2 kg   SpO2 98%   BMI 26.95 kg/m   Physical Exam Constitutional:      General: She is not in acute distress.    Appearance: Normal appearance. She is well-developed. She is not ill-appearing or diaphoretic.  HENT:     Head: Normocephalic and atraumatic.     Right Ear: External ear normal.     Left Ear: External ear normal.     Nose: Nose normal.     Mouth/Throat:     Mouth: Mucous membranes are moist.     Pharynx: Oropharynx is clear.  Eyes:     General: Vision grossly intact. Gaze aligned appropriately.     Extraocular Movements: Extraocular movements intact.     Conjunctiva/sclera: Conjunctivae normal.     Pupils: Pupils are equal, round, and reactive to light.  Neck:     Musculoskeletal: Normal range of motion.     Trachea: Trachea and phonation normal. No tracheal deviation.  Pulmonary:     Effort: Pulmonary effort is normal. No respiratory distress.  Abdominal:     General: There is no distension.     Palpations: Abdomen is soft.     Tenderness: There is no abdominal tenderness. There is no guarding or rebound.  Musculoskeletal: Normal range of motion.  Skin:    General: Skin is warm and dry.          Comments: Vesicular rash following along the L2 or L3 dermatome.  More proximal lesions have crusted over, newer lesions present along anterior left thigh.  No evidence of superimposed bacterial infection.  See pictures attached.  Physical examination and photodocumentation was chaperoned by Charna ElizabethAudrey RN.  Neurological:     Mental Status: She is alert.     GCS: GCS eye subscore is 4. GCS verbal subscore is 5. GCS motor subscore is 6.     Comments: Speech is clear and goal oriented, follows commands Major Cranial nerves without deficit, no facial droop Moves extremities without ataxia, coordination intact Normal Gait  Psychiatric:        Behavior: Behavior normal.            ED Treatments / Results  Labs (all labs ordered are listed, but only abnormal results are displayed) Labs Reviewed - No data to display  EKG None  Radiology No results found.  Procedures Procedures (including critical care time)  Medications Ordered in ED Medications - No data to display   Initial Impression / Assessment and Plan / ED Course  I have reviewed the triage vital signs and the nursing notes.  Pertinent labs & imaging results that were available during my care of the patient were reviewed by me and considered in my medical decision making (see chart for details).     63 year old female with no history of immunocompromising diseases or chronic steroid use presents today  with rash consistent with herpes zoster following along the left L2 or L3 distribution.  Rash has been present for 6 days however she is having new vesicles which have appeared within the last 24 hours.  She denies any facial, otic or ophthalmic involvement, no rectal pain or vaginal pain.  No evidence of herpes zoster ophthalmicus, Ramsay Hunt syndrome, meningitis/encephalitis or other acute neurologic complications.  No history of fever/chills and no evidence of superimposed bacterial infection.  No signs of desquamation, no concern for SJS, TPN, TSS or other life-threatening conditions.  Plan to treat patient with valacyclovir 1 g 3 times daily x7 days, patient to follow-up with PCP for recheck.  At this time there does not appear to be any evidence of an acute emergency medical condition and the patient appears stable for discharge with appropriate outpatient follow up. Diagnosis was discussed with patient who verbalizes understanding of care plan and is agreeable to discharge. I have discussed return precautions with patient who verbalizes understanding of return precautions. Patient encouraged to follow-up with their PCP. All questions answered. Patient has been discharged in good condition.    Note: Portions of this report may have been transcribed using voice recognition software. Every effort was made to ensure accuracy; however, inadvertent computerized transcription errors may still be present. Final Clinical Impressions(s) / ED Diagnoses   Final diagnoses:  Herpes zoster without complication    ED Discharge Orders         Ordered    valACYclovir (VALTREX) 1000 MG tablet  3 times daily     03/10/19 1131           Bill Salinas, PA-C 03/10/19 1140    Elizabeth Palau 03/10/19 1142    Margarita Grizzle, MD 03/12/19 1055

## 2019-03-10 NOTE — Discharge Instructions (Addendum)
You have been diagnosed today with herpes zoster (shingles).  At this time there does not appear to be the presence of an emergent medical condition, however there is always the potential for conditions to change. Please read and follow the below instructions.  Please return to the Emergency Department immediately for any new or worsening symptoms. Please be sure to follow up with your Primary Care Provider within one week regarding your visit today; please call their office to schedule an appointment even if you are feeling better for a follow-up visit. Please take the medication valacyclovir as prescribed 1000 mg 3 times a day for the next 7 days to help treat your rash.  Please call your primary care doctor's office tomorrow to schedule a follow-up appointment for recheck.  Return to the emergency department for any new or worsening symptoms.  Get help right away if: The rash is on your face or nose. You have pain in your face or pain by your eye. You lose feeling on one side of your face. You have trouble seeing. You have ear pain, or you have ringing in your ear. You have a loss of taste. You have any signs of infection in the rash area. These signs include: More redness, swelling, or pain around the rash. Fluid or blood coming from the rash. The rash area feeling warm to the touch. Pus or a bad smell coming from the rash. Your condition gets worse.  Please read the additional information packets attached to your discharge summary.  Do not take your medicine if  develop an itchy rash, swelling in your mouth or lips, or difficulty breathing.  Call 911/seek immediate emergency medical attention if this occurs.

## 2019-06-19 ENCOUNTER — Other Ambulatory Visit: Payer: Self-pay

## 2019-06-19 DIAGNOSIS — Z20822 Contact with and (suspected) exposure to covid-19: Secondary | ICD-10-CM

## 2019-06-20 LAB — NOVEL CORONAVIRUS, NAA: SARS-CoV-2, NAA: NOT DETECTED

## 2020-01-18 ENCOUNTER — Emergency Department (HOSPITAL_COMMUNITY)
Admission: EM | Admit: 2020-01-18 | Discharge: 2020-01-18 | Disposition: A | Discharge status: Home / Self Care | Payer: 59 | Attending: Emergency Medicine | Admitting: Emergency Medicine

## 2020-01-18 ENCOUNTER — Other Ambulatory Visit: Payer: Self-pay

## 2020-01-18 ENCOUNTER — Encounter (HOSPITAL_COMMUNITY): Payer: Self-pay

## 2020-01-18 DIAGNOSIS — M5442 Lumbago with sciatica, left side: Secondary | ICD-10-CM | POA: Diagnosis not present

## 2020-01-18 DIAGNOSIS — M5441 Lumbago with sciatica, right side: Secondary | ICD-10-CM | POA: Diagnosis not present

## 2020-01-18 DIAGNOSIS — F17210 Nicotine dependence, cigarettes, uncomplicated: Secondary | ICD-10-CM | POA: Diagnosis not present

## 2020-01-18 DIAGNOSIS — M545 Low back pain: Secondary | ICD-10-CM | POA: Diagnosis present

## 2020-01-18 DIAGNOSIS — J45909 Unspecified asthma, uncomplicated: Secondary | ICD-10-CM | POA: Insufficient documentation

## 2020-01-18 MED ORDER — PREDNISONE 10 MG (21) PO TBPK: ORAL_TABLET | Freq: Every day | ORAL | 0 refills | Status: DC

## 2020-01-18 MED ORDER — LIDOCAINE 5 % EX PTCH
1.0000 | MEDICATED_PATCH | Freq: Once | CUTANEOUS | Status: DC
  Administered 2020-01-18: 1 via TRANSDERMAL
  Filled 2020-01-18: qty 1

## 2020-01-18 MED ORDER — LIDOCAINE 5 % EX PTCH: 1.0000 | MEDICATED_PATCH | CUTANEOUS | 0 refills | Status: DC

## 2020-01-18 NOTE — ED Triage Notes (Signed)
Onset 3 days ago pain in left buttock and when pt gets up and walks a few feet she has shooting pain in both buttocks that radiate down left leg.  No bowel/bladder problems.  Denies injury or heavy lifting.  Pain interfering with activity.

## 2020-01-18 NOTE — Discharge Instructions (Addendum)
Please pick up medication and take as prescribed. You may also take Tylenol as needed for pain. Please avoid NSAIDs including Ibuprofen/Motrin and Aleve.  Follow up with your PCP for further evaluation and recheck of your symptoms. You may also follow up with [facility].  Return to the ED for any worsening symptoms including worsening pain, inability to ambulate, numbness in your groin area, difficulty urinating, peeing or pooping on yourself accidentally, fevers > 100.4, or any other concerning symptoms.

## 2020-01-18 NOTE — ED Provider Notes (Signed)
[facility] EMERGENCY DEPARTMENT Provider Note   CSN: [id number] Arrival date & time: 01/18/20  1324     History Chief Complaint  Patient presents with  . Back Pain    Nicole Joyce is a 64 y.o. female who presents to the ED today complaining of gradual onset, constant, worsening, bilateral buttock pain L > R x 3 days. Pt also complains of intermittent tingling sensation down BLEs posteriorly.  Denies any trauma.  Has been taking over-the-counter medication without relief.  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  She denies any recent spinal manipulation.  No history of prolonged steroid use.  No history of IV drug use.  Denies fevers, chills, weakness, numbness, saddle anesthesia, urinary retention, urinary or bowel incontinence, any other associated symptoms.  The history is provided by the patient and a friend.       Past Medical History:  Diagnosis Date  . Asthma     Patient Active Problem List   Diagnosis Date Noted  . Fall 01/05/2016    History reviewed. No pertinent surgical history.   OB History   No obstetric history on file.     History reviewed. No pertinent family history.  Social History   Tobacco Use  . Smoking status: Current Every Day Smoker    Packs/day: 0.50    Types: Cigarettes  . Smokeless tobacco: Never Used  Substance Use Topics  . Alcohol use: No  . Drug use: No    Home Medications Prior to Admission medications   Medication Sig Start Date End Date Taking? Authorizing Provider  albuterol (PROVENTIL HFA;VENTOLIN HFA) 108 (90 BASE) MCG/ACT inhaler Inhale 2 puffs into the lungs every 6 (six) hours as needed. For shortness of breath    [provider]  Aspirin-Salicylamide-Caffeine (BC HEADACHE POWDER PO) Take 1 Package by mouth daily as needed. For head ache    [provider]  HYDROcodone-acetaminophen (NORCO/VICODIN) 5-325 MG tablet Take 1 tablet  by mouth every 6 (six) hours as needed. For pain Patient not taking: Reported on 08/24/2016 04/02/16   Linna Hoff, MD  ibuprofen (ADVIL,MOTRIN) 200 MG tablet Take 200 mg by mouth every 6 (six) hours as needed for mild pain.    [provider]  lidocaine (LIDODERM) 5 % Place 1 patch onto the skin daily. Remove & Discard patch within 12 hours or as directed by MD 01/18/20   Tanda Rockers, PA-C  naproxen (NAPROSYN) 500 MG tablet TAKE 1 TABLET TWICE A DAY AS NEEDED WITH FOOD 05/24/16   [provider]  oxyCODONE (OXY IR/ROXICODONE) 5 MG immediate release tablet Take 1 tablet (5 mg total) by mouth every 4 (four) hours as needed for moderate pain. Patient not taking: Reported on 08/24/2016 01/06/16   Violeta Gelinas, MD  predniSONE (STERAPRED UNI-PAK 21 TAB) 10 MG (21) TBPK tablet Take by mouth daily. Take 6 tabs by mouth daily  for 2 days, then 5 tabs for 2 days, then 4 tabs for 2 days, then 3 tabs for 2 days, 2 tabs for 2 days, then 1 tab by mouth daily for 2 days 01/18/20   Tanda Rockers, PA-C    Allergies    Patient has no known allergies.  Review of Systems   Review of Systems  Constitutional: Negative for chills and fever.  Genitourinary: Negative for difficulty urinating.  Musculoskeletal: Positive for arthralgias and back pain.  Neurological: Negative for weakness and  numbness.    Physical Exam Updated Vital Signs BP (!) 141/85 (BP Location: Right Arm)   Pulse (!) 110   Temp 98.2 F (36.8 C) (Oral)   Resp 20   Ht 5\' 4"  (1.626 m)   Wt 68 kg   SpO2 96%   BMI 25.75 kg/m   Physical Exam Vitals and nursing note reviewed.  Constitutional:      Appearance: She is not ill-appearing.  HENT:     Head: Normocephalic and atraumatic.  Eyes:     Conjunctiva/sclera: Conjunctivae normal.  Cardiovascular:     Rate and Rhythm: Normal rate and regular rhythm.     Pulses: Normal pulses.  Pulmonary:     Effort: Pulmonary effort is normal.     Breath sounds: Normal breath  sounds. No wheezing, rhonchi or rales.  Musculoskeletal:       Back:     Comments: No C, T, or L midline spinal tenderness. + Bilateral paraspinal lumbar TTP. ROM intact to neck and back. Strength and sensation intact to BUE and BLEs. 2+ DP and PT pulse. + SLR on left. Negative SLR on right.   Skin:    General: Skin is warm and dry.     Coloration: Skin is not jaundiced.  Neurological:     Mental Status: She is alert.     ED Results / Procedures / Treatments   Labs (all labs ordered are listed, but only abnormal results are displayed) Labs Reviewed - No data to display  EKG None  Radiology No results found.  Procedures Procedures (including critical care time)  Medications Ordered in ED Medications  lidocaine (LIDODERM) 5 % 1 patch (1 patch Transdermal Patch Applied 01/18/20 1510)    ED Course  I have reviewed the triage vital signs and the nursing notes.  Pertinent labs & imaging results that were available during my care of the patient were reviewed by me and considered in my medical decision making (see chart for details).    MDM Rules/Calculators/A&P                      64 year old female who presents to the ED today complaining of bilateral back pain worse on the left side than the right.  States pain is mostly in her buttock area and radiates down her legs.  On arrival to the ED patient is tachycardic in the 110s however afebrile and nontachypneic.  She appears to be in no acute distress.  She has tenderness to her bilateral paraspinal lumbar musculature.  No midline spinal tenderness.  She does have positive straight leg raise on the left however strength and sensation intact throughout.  Do not feel patient needs imaging at this time.  Will discharge home with steroid Dosepak and Lidoderm patches and have patient follow-up outpatient with her PCP.  Return precautions have been discussed with patient.  She has no red flag symptoms currently concerning for cauda equina,  spinal epidural abscess, AAA.  In agreement with plan and stable for discharge home.   Recheck of heart rate 75.  Will discharge home at this time.   This note was prepared using Dragon voice recognition software and may include unintentional dictation errors due to the inherent limitations of voice recognition software.  Final Clinical Impression(s) / ED Diagnoses Final diagnoses:  Acute bilateral low back pain with bilateral sciatica    Rx / DC Orders ED Discharge Orders         Ordered  predniSONE (STERAPRED UNI-PAK 21 TAB) 10 MG (21) TBPK tablet  Daily     01/18/20 1451    lidocaine (LIDODERM) 5 %  Every 24 hours     01/18/20 1451           Discharge Instructions     Please pick up medication and take as prescribed. You may also take Tylenol as needed for pain. Please avoid NSAIDs including Ibuprofen/Motrin and Aleve.  Follow up with your PCP for further evaluation and recheck of your symptoms. You may also follow up with [facility].  Return to the ED for any worsening symptoms including worsening pain, inability to ambulate, numbness in your groin area, difficulty urinating, peeing or pooping on yourself accidentally, fevers > 100.4, or any other concerning symptoms.        Eustaquio Maize, PA-C 01/18/20 1516    Charlesetta Shanks, MD 01/19/20 [phone]

## 2020-02-03 ENCOUNTER — Encounter: Payer: Self-pay | Admitting: Family Medicine

## 2020-02-03 ENCOUNTER — Ambulatory Visit: Payer: 59 | Admitting: Family Medicine

## 2020-02-03 ENCOUNTER — Other Ambulatory Visit: Payer: Self-pay

## 2020-02-03 VITALS — BP 110/70 | HR 77 | Temp 97.7°F | Resp 16 | Ht 65.0 in | Wt 163.4 lb

## 2020-02-03 DIAGNOSIS — F17200 Nicotine dependence, unspecified, uncomplicated: Secondary | ICD-10-CM | POA: Diagnosis not present

## 2020-02-03 DIAGNOSIS — J4520 Mild intermittent asthma, uncomplicated: Secondary | ICD-10-CM

## 2020-02-03 DIAGNOSIS — M79602 Pain in left arm: Secondary | ICD-10-CM

## 2020-02-03 DIAGNOSIS — M5442 Lumbago with sciatica, left side: Secondary | ICD-10-CM | POA: Diagnosis not present

## 2020-02-03 DIAGNOSIS — G8929 Other chronic pain: Secondary | ICD-10-CM | POA: Insufficient documentation

## 2020-02-03 DIAGNOSIS — M79601 Pain in right arm: Secondary | ICD-10-CM | POA: Insufficient documentation

## 2020-02-03 DIAGNOSIS — Z8619 Personal history of other infectious and parasitic diseases: Secondary | ICD-10-CM

## 2020-02-03 HISTORY — DX: Pain in right arm: M79.601

## 2020-02-03 HISTORY — DX: Personal history of other infectious and parasitic diseases: Z86.19

## 2020-02-03 HISTORY — DX: Nicotine dependence, unspecified, uncomplicated: F17.200

## 2020-02-03 MED ORDER — DULOXETINE HCL 30 MG PO CPEP: 30.0000 mg | ORAL_CAPSULE | Freq: Every day | ORAL | 1 refills | Status: DC

## 2020-02-03 NOTE — Patient Instructions (Signed)
Start on the once daily Cymbalta. Follow up with me in 4 weeks or sooner if needed.  Let me know if you have any significant side effects.     Duloxetine delayed-release capsules What is this medicine? DULOXETINE (doo LOX e teen) is used to treat depression, anxiety, and different types of chronic pain. This medicine may be used for other purposes; ask your health care provider or pharmacist if you have questions. COMMON BRAND NAME(S): Cymbalta, Creig Hines, Irenka What should I tell my health care provider before I take this medicine? They need to know if you have any of these conditions:  bipolar disorder  glaucoma  high blood pressure  kidney disease  liver disease  seizures  suicidal thoughts, plans or attempt; a previous suicide attempt by you or a family member  take medicines that treat or prevent blood clots  taken medicines called MAOIs like Carbex, Eldepryl, Marplan, Nardil, and Parnate within 14 days  trouble passing urine  an unusual reaction to duloxetine, other medicines, foods, dyes, or preservatives  pregnant or trying to get pregnant  breast-feeding How should I use this medicine? Take this medicine by mouth with a glass of water. Follow the directions on the prescription label. Do not crush, cut or chew some capsules of this medicine. Some capsules may be opened and sprinkled on applesauce. Check with your doctor or pharmacist if you are not sure. You can take this medicine with or without food. Take your medicine at regular intervals. Do not take your medicine more often than directed. Do not stop taking this medicine suddenly except upon the advice of your doctor. Stopping this medicine too quickly may cause serious side effects or your condition may worsen. A special MedGuide will be given to you by the pharmacist with each prescription and refill. Be sure to read this information carefully each time. Talk to your pediatrician regarding the use of this  medicine in children. While this drug may be prescribed for children as young as 46 years of age for selected conditions, precautions do apply. Overdosage: If you think you have taken too much of this medicine contact a poison control center or emergency room at once. NOTE: This medicine is only for you. Do not share this medicine with others. What if I miss a dose? If you miss a dose, take it as soon as you can. If it is almost time for your next dose, take only that dose. Do not take double or extra doses. What may interact with this medicine? Do not take this medicine with any of the following medications:  desvenlafaxine  levomilnacipran  linezolid  MAOIs like Carbex, Eldepryl, Marplan, Nardil, and Parnate  methylene blue (injected into a vein)  milnacipran  thioridazine  venlafaxine This medicine may also interact with the following medications:  alcohol  amphetamines  aspirin and aspirin-like medicines  certain antibiotics like ciprofloxacin and enoxacin  certain medicines for blood pressure, heart disease, irregular heart beat  certain medicines for depression, anxiety, or psychotic disturbances  certain medicines for migraine headache like almotriptan, eletriptan, frovatriptan, naratriptan, rizatriptan, sumatriptan, zolmitriptan  certain medicines that treat or prevent blood clots like warfarin, enoxaparin, and dalteparin  cimetidine  fentanyl  lithium  NSAIDS, medicines for pain and inflammation, like ibuprofen or naproxen  phentermine  procarbazine  rasagiline  sibutramine  St. John's wort  theophylline  tramadol  tryptophan This list may not describe all possible interactions. Give your health care provider a list of all the medicines, herbs,  non-prescription drugs, or dietary supplements you use. Also tell them if you smoke, drink alcohol, or use illegal drugs. Some items may interact with your medicine. What should I watch for while using  this medicine? Tell your doctor if your symptoms do not get better or if they get worse. Visit your doctor or healthcare provider for regular checks on your progress. Because it may take several weeks to see the full effects of this medicine, it is important to continue your treatment as prescribed by your doctor. This medicine may cause serious skin reactions. They can happen weeks to months after starting the medicine. Contact your healthcare provider right away if you notice fevers or flu-like symptoms with a rash. The rash may be red or purple and then turn into blisters or peeling of the skin. Or, you might notice a red rash with swelling of the face, lips, or lymph nodes in your neck or under your arms. Patients and their families should watch out for new or worsening thoughts of suicide or depression. Also watch out for sudden changes in feelings such as feeling anxious, agitated, panicky, irritable, hostile, aggressive, impulsive, severely restless, overly excited and hyperactive, or not being able to sleep. If this happens, especially at the beginning of treatment or after a change in dose, call your healthcare provider. You may get drowsy or dizzy. Do not drive, use machinery, or do anything that needs mental alertness until you know how this medicine affects you. Do not stand or sit up quickly, especially if you are an older patient. This reduces the risk of dizzy or fainting spells. Alcohol may interfere with the effect of this medicine. Avoid alcoholic drinks. This medicine can cause an increase in blood pressure. This medicine can also cause a sudden drop in your blood pressure, which may make you feel faint and increase the chance of a fall. These effects are most common when you first start the medicine or when the dose is increased, or during use of other medicines that can cause a sudden drop in blood pressure. Check with your doctor for instructions on monitoring your blood pressure while  taking this medicine. Your mouth may get dry. Chewing sugarless gum or sucking hard candy, and drinking plenty of water, may help. Contact your doctor if the problem does not go away or is severe. What side effects may I notice from receiving this medicine? Side effects that you should report to your doctor or health care professional as soon as possible:  allergic reactions like skin rash, itching or hives, swelling of the face, lips, or tongue  anxious  breathing problems  confusion  changes in vision  chest pain  confusion  elevated mood, decreased need for sleep, racing thoughts, impulsive behavior  eye pain  fast, irregular heartbeat  feeling faint or lightheaded, falls  feeling agitated, angry, or irritable  hallucination, loss of contact with reality  high blood pressure  loss of balance or coordination  palpitations  redness, blistering, peeling or loosening of the skin, including inside the mouth  restlessness, pacing, inability to keep still  seizures  stiff muscles  suicidal thoughts or other mood changes  trouble passing urine or change in the amount of urine  trouble sleeping  unusual bleeding or bruising  unusually weak or tired  vomiting  yellowing of the eyes or skin Side effects that usually do not require medical attention (report to your doctor or health care professional if they continue or are bothersome):  change  in sex drive or performance  change in appetite or weight  constipation  dizziness  dry mouth  headache  increased sweating  nausea  tired This list may not describe all possible side effects. Call your doctor for medical advice about side effects. You may report side effects to FDA at 1-800-FDA-1088. Where should I keep my medicine? Keep out of the reach of children. Store at room temperature between 15 and 30 degrees C (59 to 86 degrees F). Throw away any unused medicine after the expiration date. NOTE:  This sheet is a summary. It may not cover all possible information. If you have questions about this medicine, talk to your doctor, pharmacist, or health care provider.  2020 Elsevier/Gold Standard (2019-01-03 13:47:50)

## 2020-02-03 NOTE — Progress Notes (Signed)
   Subjective:    Patient ID: Nicole Joyce, female    DOB: August 26, 1956, 64 y.o.   MRN: [id number]  HPI Chief Complaint  Patient presents with  . new pt    new pt, established. asthmas bronchical- smoker's cough- [facility] per patient   She is new to the practice and here to establish care.  Previous medical care: [facility] [place]. Last there in 8 months ago.   Other providers: None   Complains of pain in multiple areas.  States she had to go to the ED earlier this month with low back pain. The pain was not related to injury. She had radiation into both buttocks and down posterior legs to her knees with the R worse than L. She also repots numbness and tingling intermittently.  Denies fever, chills, loss of control of bowels or bladder.  She has been using Lidocaine patches and heating pad for pain relief. States she does not like to take oral medication.   States in May 2020 she had shingles in her left lower abdomen and the rash and pain wrapped around her left hip. States she fully recovered from this.  Bilateral low   Reports history of neck pain after a fall down her stairs at home in 2017.   States she hurts "all over" sometimes. Her left knee hurts at times as well as her whole left side.   Smokes- 8-10 per day.  Asthma- controlled per patient.  Using albuterol but not often.   No chest pain, palpitations, shortness of breath, abdominal pain, N/V/D or urinary symptoms.   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   She works as a Runner, broadcasting/film/video.   Reviewed allergies, medications, past medical, surgical, family, and social history.    Review of Systems Pertinent positives and negatives in the history of present illness.     Objective:   Physical Exam BP 110/70   Pulse 77   Temp 97.7 F (36.5 C)   Resp 16   Ht 5\' 5"  (1.651 m)   Wt 163 lb 6.4 oz (74.1 kg)   SpO2 97%   BMI 27.19 kg/m   Alert and in no distress. . Neck is supple without adenopathy, ROM normal.  Cardiac  exam shows a regular sinus rhythm without murmurs or gallops. Lungs are clear to auscultation. Upper extremities with normal sensation, motion and strength. Pain in bilateral upper arms with resistance. Back with normal sensation and motion. L paraspinal TTP. Normal strength bilaterally. Negative straight leg raise. Skin is warm and dry.       Assessment & Plan:  Chronic left-sided low back pain with left-sided sciatica - Plan: Ambulatory referral to Orthopedic Surgery, DULoxetine (CYMBALTA) 30 MG capsule, CBC with Differential/Platelet, Comprehensive metabolic panel  Bilateral arm pain - Plan: DULoxetine (CYMBALTA) 30 MG capsule, CBC with Differential/Platelet, Comprehensive metabolic panel, Sedimentation rate, Rheumatoid factor  Mild intermittent asthma without complication  Smoker  History of shingles  Other chronic pain - Plan: CBC with Differential/Platelet, Comprehensive metabolic panel, Sedimentation rate, Rheumatoid factor  She is new to me today. Complains of arthralgias and myalgias for several months. Bilateral upper and lower extremities are neurovascularly intact.  I will check labs including sed rate and RF. She is interested in trying medication. We will start her on Cymbalta. Discussed possible side effects. Refer to ortho for further evaluation.  Asthma is controlled.  Encouraged her to stop smoking.  Follow up in 4 weeks or sooner if needed.

## 2020-02-04 ENCOUNTER — Other Ambulatory Visit: Payer: Self-pay | Admitting: Internal Medicine

## 2020-02-04 DIAGNOSIS — E875 Hyperkalemia: Secondary | ICD-10-CM

## 2020-02-04 LAB — CBC WITH DIFFERENTIAL/PLATELET
Basophils Absolute: 0 10*3/uL (ref 0.0–0.2)
Basos: 1 %
EOS (ABSOLUTE): 0 10*3/uL (ref 0.0–0.4)
Eos: 0 %
Hematocrit: 39.3 % (ref 34.0–46.6)
Hemoglobin: 13.2 g/dL (ref 11.1–15.9)
Immature Grans (Abs): 0 10*3/uL (ref 0.0–0.1)
Immature Granulocytes: 0 %
Lymphocytes Absolute: 2.6 10*3/uL (ref 0.7–3.1)
Lymphs: 52 %
MCH: 29.7 pg (ref 26.6–33.0)
MCHC: 33.6 g/dL (ref 31.5–35.7)
MCV: 89 fL (ref 79–97)
Monocytes Absolute: 0.6 10*3/uL (ref 0.1–0.9)
Monocytes: 11 %
Neutrophils Absolute: 1.8 10*3/uL (ref 1.4–7.0)
Neutrophils: 36 %
Platelets: 298 10*3/uL (ref 150–450)
RBC: 4.44 x10E6/uL (ref 3.77–5.28)
RDW: 13.5 % (ref 11.7–15.4)
WBC: 5 10*3/uL (ref 3.4–10.8)

## 2020-02-04 LAB — COMPREHENSIVE METABOLIC PANEL
ALT: 17 IU/L (ref 0–32)
AST: 16 IU/L (ref 0–40)
Albumin/Globulin Ratio: 1.9 (ref 1.2–2.2)
Albumin: 4.4 g/dL (ref 3.8–4.8)
Alkaline Phosphatase: 82 IU/L (ref 39–117)
BUN/Creatinine Ratio: 13 (ref 12–28)
BUN: 11 mg/dL (ref 8–27)
Bilirubin Total: 0.7 mg/dL (ref 0.0–1.2)
CO2: 31 mmol/L — ABNORMAL HIGH (ref 20–29)
Calcium: 9.3 mg/dL (ref 8.7–10.3)
Chloride: 104 mmol/L (ref 96–106)
Creatinine, Ser: 0.83 mg/dL (ref 0.57–1.00)
GFR calc Af Amer: 86 mL/min/{1.73_m2} (ref >59)
GFR calc non Af Amer: 75 mL/min/{1.73_m2} (ref >59)
Globulin, Total: 2.3 g/dL (ref 1.5–4.5)
Glucose: 88 mg/dL (ref 65–99)
Potassium: 5.5 mmol/L — ABNORMAL HIGH (ref 3.5–5.2)
Sodium: 143 mmol/L (ref 134–144)
Total Protein: 6.7 g/dL (ref 6.0–8.5)

## 2020-02-04 LAB — SEDIMENTATION RATE: Sed Rate: 12 mm/hr (ref 0–40)

## 2020-02-04 LAB — RHEUMATOID FACTOR: Rhuematoid fact SerPl-aCnc: <10 IU/mL (ref 0.0–13.9)

## 2020-02-04 NOTE — Progress Notes (Signed)
   Subjective:    Patient ID: Nicole Joyce, female    DOB: 01-23-1956, 64 y.o.   MRN: [id number]  HPI    Review of Systems     Objective:   Physical Exam        Assessment & Plan:

## 2020-02-04 NOTE — Progress Notes (Signed)
Needs a lab visit to recheck her potassium level either Thursday, Friday or Monday. BMP for hyperkalemia.

## 2020-02-07 ENCOUNTER — Ambulatory Visit (INDEPENDENT_AMBULATORY_CARE_PROVIDER_SITE_OTHER): Payer: 59 | Admitting: Family Medicine

## 2020-02-07 ENCOUNTER — Other Ambulatory Visit: Payer: 59

## 2020-02-07 ENCOUNTER — Encounter: Payer: Self-pay | Admitting: Family Medicine

## 2020-02-07 ENCOUNTER — Other Ambulatory Visit: Payer: Self-pay

## 2020-02-07 DIAGNOSIS — E875 Hyperkalemia: Secondary | ICD-10-CM

## 2020-02-07 DIAGNOSIS — M545 Low back pain, unspecified: Secondary | ICD-10-CM

## 2020-02-07 DIAGNOSIS — M79602 Pain in left arm: Secondary | ICD-10-CM | POA: Diagnosis not present

## 2020-02-07 DIAGNOSIS — G8929 Other chronic pain: Secondary | ICD-10-CM | POA: Diagnosis not present

## 2020-02-07 MED ORDER — GABAPENTIN 100 MG PO CAPS: ORAL_CAPSULE | ORAL | 3 refills | Status: DC

## 2020-02-07 NOTE — Progress Notes (Signed)
Office Visit Note   Patient: Nicole Joyce           Date of Birth: 1955/10/31           MRN: [id number] Visit Date: 02/07/2020 Requested by: Girtha Rm, NP-C [place],  [place] [id number] PCP: Girtha Rm, NP-C  Subjective: Chief Complaint  Patient presents with  . Lower Back - Pain    Pain in the left buttock/hip region. H/o shingles approx. 6 months ago - had rash lateral hip region & some on posterior hip. Has had pain since then. Leg would give way on her 2 months ago. Not now.  . Left Arm - Pain    Cramping pain in the right arm, down to the hand. Started 3 weeks ago. No pain with standing. Hurts when she sits down.    HPI: She is here with low back pain.  About 6 months ago she was diagnosed with shingles in the left posterior lateral hip.  She was treated accordingly and the rash resolved.  She has continued to have pain which radiates down the leg intermittently.  Sometimes the leg feels like it is going to give out.  Denies bowel or bladder dysfunction.  She also complains of intermittent pain in the left arm.  Sometimes when her leg is hurting, if she pushes on the dorsum of her left hand it makes the leg quit hurting.  She takes Cymbalta for her pain.               ROS: No fevers or chills.  All other systems were reviewed and are negative.  Objective: Vital Signs: There were no vitals taken for this visit.  Physical Exam:  General:  Alert and oriented, in no acute distress. Pulm:  Breathing unlabored. Psy:  Normal mood, congruent affect. Skin: No rash. Left arm: She has good range of motion of her arm with normal strength.  She is tender on the dorsum of her hand between the third metatarsals. Low back: No significant midline tenderness.  She is tender in the sciatic notch and near the greater trochanter.  No pain with internal hip rotation.  Straight leg raise is negative, lower extremity strength and reflexes are normal.  Imaging: No  results found.  Assessment & Plan: 1.  Left-sided low back and leg pain, possibilities include postherpetic neuralgia versus lumbar disc protrusion.  Neurologic exam is nonfocal today. -We will try gabapentin, physical therapy.  If symptoms persist then x-rays and MRI scan of lumbar spine.  2.  Left arm pain -We will treat with physical therapy.      Procedures: No procedures performed  No notes on file     PMFS History: Patient Active Problem List   Diagnosis Date Noted  . Chronic left-sided low back pain with left-sided sciatica 02/03/2020  . Smoker 02/03/2020  . Mild intermittent asthma without complication 02/40/9735  . Bilateral arm pain 02/03/2020  . History of shingles 02/03/2020  . Fall 01/05/2016   Past Medical History:  Diagnosis Date  . Asthma     History reviewed. No pertinent family history.  Past Surgical History:  Procedure Laterality Date  . ROTATOR CUFF REPAIR     Social History   Occupational History  . Not on file  Tobacco Use  . Smoking status: Current Every Day Smoker    Packs/day: 0.50    Types: Cigarettes  . Smokeless tobacco: Never Used  Substance and Sexual Activity  . Alcohol use:  No  . Drug use: No  . Sexual activity: Not on file

## 2020-02-08 LAB — BASIC METABOLIC PANEL WITH GFR
BUN/Creatinine Ratio: 18 (ref 12–28)
BUN: 14 mg/dL (ref 8–27)
CO2: 26 mmol/L (ref 20–29)
Calcium: 9.8 mg/dL (ref 8.7–10.3)
Chloride: 103 mmol/L (ref 96–106)
Creatinine, Ser: 0.78 mg/dL (ref 0.57–1.00)
GFR calc Af Amer: 93 mL/min/{1.73_m2}
GFR calc non Af Amer: 81 mL/min/{1.73_m2}
Glucose: 89 mg/dL (ref 65–99)
Potassium: 5.1 mmol/L (ref 3.5–5.2)
Sodium: 142 mmol/L (ref 134–144)

## 2020-02-10 ENCOUNTER — Telehealth: Payer: Self-pay

## 2020-02-10 ENCOUNTER — Other Ambulatory Visit: Payer: 59

## 2020-02-10 NOTE — Telephone Encounter (Signed)
Pt wants to know if it is ok to take the Cymbalta with the gabapentin. She said it is ok to leave a message  Best CB# [phone]

## 2020-02-10 NOTE — Telephone Encounter (Signed)
I called and advised the patient. 

## 2020-02-10 NOTE — Telephone Encounter (Signed)
Please advise 

## 2020-02-10 NOTE — Telephone Encounter (Signed)
Yes, ok to take both.

## 2020-02-12 ENCOUNTER — Telehealth: Payer: Self-pay | Admitting: Family Medicine

## 2020-02-12 NOTE — Telephone Encounter (Signed)
Requested records received from [facility]

## 2020-02-19 ENCOUNTER — Other Ambulatory Visit: Payer: Self-pay

## 2020-02-19 ENCOUNTER — Encounter: Payer: Self-pay | Admitting: Physical Therapy

## 2020-02-19 ENCOUNTER — Ambulatory Visit (INDEPENDENT_AMBULATORY_CARE_PROVIDER_SITE_OTHER): Payer: 59 | Admitting: Physical Therapy

## 2020-02-19 DIAGNOSIS — M6281 Muscle weakness (generalized): Secondary | ICD-10-CM | POA: Diagnosis not present

## 2020-02-19 DIAGNOSIS — M545 Low back pain, unspecified: Secondary | ICD-10-CM

## 2020-02-19 DIAGNOSIS — M25512 Pain in left shoulder: Secondary | ICD-10-CM

## 2020-02-19 DIAGNOSIS — M542 Cervicalgia: Secondary | ICD-10-CM | POA: Diagnosis not present

## 2020-02-19 DIAGNOSIS — G8929 Other chronic pain: Secondary | ICD-10-CM

## 2020-02-20 NOTE — Patient Instructions (Signed)
Access Code: [email or web address]: [email or web address]: 05/05/2021Prepared by: Arlys John NelsonExercises  Seated Cervical Retraction - 2 x daily - 6 x weekly - 10 reps - 1-3 sets  Seated Scapular Retraction - 2 x daily - 6 x weekly - 10 reps - 2-3 sets - 5 sec hold  Seated Assisted Cervical Rotation with Towel - 2 x daily - 6 x weekly - 10 reps - 1 sets - 5 hold  Seated Cervical Sidebending Stretch - 2 x daily - 6 x weekly - 2-3 reps - 1 sets - 30 sec hold  Supine Piriformis Stretch with Foot on Ground - 2 x daily - 6 x weekly - 3 sets - 30 hold  Supine Bridge - 2 x daily - 6 x weekly - 10 reps - 1-2 sets - 5 hold Patient Education  TENS Therapy

## 2020-02-20 NOTE — Addendum Note (Signed)
Addended by: April Manson on: 02/20/2020 08:05 AM   Modules accepted: Orders

## 2020-02-20 NOTE — Therapy (Signed)
[facility] Physical Therapy [address] [place], [place], [address] Phone: [phone]   Fax:  [phone]  Physical Therapy Evaluation  Patient Details  Name: Nicole Joyce MRN: [id number] Date of Birth: 1955/10/29 Referring Provider (PT): Hilts, MD   Encounter Date: 02/19/2020  PT End of Session - 02/20/20 0754    Visit Number  1    Number of Visits  12    Date for PT Re-Evaluation  04/02/20    Authorization Type  [employer]    PT Start Time  1600    PT Stop Time  1655    PT Time Calculation (min)  55 min    Activity Tolerance  Patient tolerated treatment well    Behavior During Therapy  [facility] for tasks assessed/performed       Past Medical History:  Diagnosis Date  . Asthma     Past Surgical History:  Procedure Laterality Date  . ROTATOR CUFF REPAIR      There were no vitals filed for this visit.   Subjective Assessment - 02/19/20 1609    Subjective  She says leg history of low back pain and neck pain. She had H/o shingles approx. 6 months ago - had rash lateral hip region & some on posterior hip. Has had pain since then. Leg feels like they might give way on her or feels like she is uncoordinated. She relays she has fell twice in the last month. Pain and weakness is on left leg only and denies pain in Rt leg. Biggest complaint is her neck and shoulder pain with stiffeness    Pertinent History  PMH: fall, chronic LBP, shingles, asthma    Limitations  Lifting;Standing;House hold activities    How long can you stand comfortably?  15 min    How long can you walk comfortably?  2 hours    Diagnostic tests  neck MRI in 2017 showing stenosis and disc bulge, no lumbar imaging    Currently in Pain?  Yes    Pain Score  7     Pain Location  Neck    Pain Orientation  Right;Left    Pain Descriptors / Indicators  Aching    Pain Type  Chronic pain    Pain Radiating Towards  down both arms    Pain Onset  More than a month ago    Pain Frequency  Constant     Aggravating Factors   raising her arms, shrugging, sleeping    Pain Relieving Factors  heat    Multiple Pain Sites  Yes    Pain Score  4    Pain Location  Back    Pain Orientation  Left    Pain Descriptors / Indicators  Shooting;Numbness   feels like her big toe is asleep   Pain Radiating Towards  Lt leg to her big toe    Pain Onset  More than a month ago    Pain Frequency  Intermittent    Aggravating Factors   standing    Pain Relieving Factors  pain patch, resting         [facility] PT Assessment - 02/20/20 0001      Assessment   Medical Diagnosis  Chronic Lt sided low back pain, neck and Lt arm pain    Referring Provider (PT)  Hilts, MD    Onset Date/Surgical Date  --   chronic pain >6 months   Hand Dominance  Right    Next MD Visit  PRN  Prior Therapy  nothing recent      Precautions   Precautions  None      Restrictions   Weight Bearing Restrictions  No      Balance Screen   Has the patient fallen in the past 6 months  Yes    How many times?  3    Has the patient had a decrease in activity level because of a fear of falling?   No    Is the patient reluctant to leave their home because of a fear of falling?   No      Home Public house manager residence      Prior Function   Level of Independence  Independent    Vocation  Full time employment    Press photographer      Cognition   Overall Cognitive Status  Within Functional Limits for tasks assessed      Posture/Postural Control   Posture Comments  slumped posture, fwd head, rounded shoulders      AROM   Cervical Flexion  25    Cervical Extension  20    Cervical - Right Side Bend  25    Cervical - Left Side Bend  20    Cervical - Right Rotation  40    Cervical - Left Rotation  42    Lumbar Flexion  WFL    Lumbar Extension  75%    Lumbar - Right Side Bend  WNL    Lumbar - Left Side Bend  WNL    Lumbar - Right Rotation  25%   much pain   Lumbar - Left Rotation  50%    mild pain     Flexibility   Soft Tissue Assessment /Muscle Length  --   tightness in upper traps     Palpation   Palpation comment  TTP cervical P.S, traps, piriformis, lower lumbar and upper sacral       Special Tests   Other special tests  +spurlings test for cervical impingment, negative SLR test on Rt, + SLR on Lt,+ quadrant test for facet arthropathy      Transfers   Transfers  Independent with all Transfers      Ambulation/Gait   Gait Comments  ambulates community distances no AD, slower speed                Objective measurements completed on examination: See above findings.      [facility] Adult PT Treatment/Exercise - 02/20/20 0001      Modalities   Modalities  Electrical Stimulation;Moist Heat      Moist Heat Therapy   Number Minutes Moist Heat  10 Minutes    Moist Heat Location  Cervical;Lumbar Spine      Electrical Stimulation   Electrical Stimulation Location  neck, back    Electrical Stimulation Action  pre mod to both    Electrical Stimulation Parameters  to tolerance in sitting    Electrical Stimulation Goals  Pain;Tone             PT Education - 02/20/20 0753    Education Details  HEP, home TENS, POC    Person(s) Educated  Patient    Methods  Explanation;Demonstration;Verbal cues;Handout    Comprehension  Verbalized understanding;Need further instruction;Returned demonstration          PT Long Term Goals - 02/20/20 0800      PT LONG TERM GOAL #1   Title  upon  discharge pt will be I with all HEP given throughout therapy (target for all goals 6 weeks 04/02/20))    Period  Weeks    Status  New      PT LONG TERM GOAL #2   Title  pt will increase neck and shoulder AROM to [facility] >60% available    Time  6    Period  Weeks    Status  New      PT LONG TERM GOAL #3   Title  pt will demonstrate increased UE/LE strength to 4+/5 in all planes    Time  6    Period  Weeks    Status  Achieved      PT LONG TERM GOAL #4   Title  She will  reduce overall pain to less than 3-4/10 with usual activity and sleeping    Time  6    Period  Weeks    Status  New      PT LONG TERM GOAL #5   Title  -             Plan - 02/20/20 0754    Clinical Impression Statement  Pt presents with chronic neck pain, Lt shoulder pain, and Lt sided low back pain. She has not had imaging since 2017 for her neck which showed some stenosis and disc bulges, no lumbar imaging. She has overall decreased neck and lumbar ROM, decreased overall strength, muscle tightness with triggerpoints, and increased pain limiting her function. She will benefit from skilled PT to address her deficits.    Personal Factors and Comorbidities  Comorbidity 3+    Comorbidities  PMH: fall, chronic LBP, shingles, asthma    Examination-Activity Limitations  Carry;Stand;Lift;Squat;Sleep;Locomotion Level    Examination-Participation Restrictions  Investment banker, operational;Shop    Stability/Clinical Decision Making  Evolving/Moderate complexity    Clinical Decision Making  Moderate    Rehab Potential  Good    PT Frequency  2x / week   1-2   PT Treatment/Interventions  ADLs/Self Care Home Management;Cryotherapy;Electrical Stimulation;Iontophoresis 4mg /ml Dexamethasone;Moist Heat;Traction;Ultrasound;Gait training;Therapeutic activities;Therapeutic exercise;Balance training;Neuromuscular re-education;Manual techniques;Passive range of motion;Dry needling;Joint Manipulations;Spinal Manipulations;Taping    PT Next Visit Plan  review and update HEP PRN, graded exercise, consider modalaties or DN for pain    PT Home Exercise Plan  Access Code: [email or web address]       Patient will benefit from skilled therapeutic intervention in order to improve the following deficits and impairments:  Decreased activity tolerance, Decreased mobility, Decreased range of motion, Decreased strength, Hypomobility, Difficulty walking, Increased muscle spasms, Impaired flexibility, Postural dysfunction,  Pain, Improper body mechanics  Visit Diagnosis: Cervicalgia  Chronic left-sided low back pain, unspecified whether sciatica present  Muscle weakness (generalized)  Chronic left shoulder pain     Problem List Patient Active Problem List   Diagnosis Date Noted  . Chronic left-sided low back pain with left-sided sciatica 02/03/2020  . Smoker 02/03/2020  . Mild intermittent asthma without complication 95/62/1308  . Bilateral arm pain 02/03/2020  . History of shingles 02/03/2020  . Fall 01/05/2016    Silvestre Mesi 02/20/2020, 8:04 AM  [facility] [address] [place], [place], [address] Phone: [phone]   Fax:  [phone]  Name: Nicole Joyce MRN: [id number] Date of Birth: Jan 12, 1956

## 2020-02-24 ENCOUNTER — Other Ambulatory Visit: Payer: Self-pay

## 2020-02-24 ENCOUNTER — Encounter: Payer: Self-pay | Admitting: Family Medicine

## 2020-02-24 ENCOUNTER — Ambulatory Visit: Payer: 59 | Admitting: Family Medicine

## 2020-02-24 VITALS — BP 124/80 | HR 87 | Temp 97.3°F | Wt 161.6 lb

## 2020-02-24 DIAGNOSIS — M5442 Lumbago with sciatica, left side: Secondary | ICD-10-CM

## 2020-02-24 DIAGNOSIS — G8929 Other chronic pain: Secondary | ICD-10-CM

## 2020-02-24 MED ORDER — DICLOFENAC SODIUM 75 MG PO TBEC: 75.0000 mg | DELAYED_RELEASE_TABLET | Freq: Two times a day (BID) | ORAL | 0 refills | Status: DC

## 2020-02-24 MED ORDER — KETOROLAC TROMETHAMINE 60 MG/2ML IM SOLN
60.0000 mg | Freq: Once | INTRAMUSCULAR | Status: AC
  Administered 2020-02-24: 60 mg via INTRAMUSCULAR

## 2020-02-24 MED ORDER — LIDOCAINE 5 % EX PTCH: 1.0000 | MEDICATED_PATCH | CUTANEOUS | 0 refills | Status: DC

## 2020-02-24 NOTE — Patient Instructions (Signed)
We gave you a Toradol 60 mg IM injection in the office.  I am also prescribing you diclofenac to take twice daily with food.  I refilled your lidocaine patches.  Please call and schedule a follow-up appointment with Dr. Prince Rome as soon as possible.

## 2020-02-24 NOTE — Progress Notes (Signed)
Subjective:    Patient ID: Nicole Joyce, female    DOB: [birth year], 64 y.o.   MRN: [id number]  HPI Chief Complaint  Patient presents with  . back pain    left side sciatica pain, started yesterday morning.    Complains of left low back pain that radiates into her left buttock and down her left leg. She has a history of this pain. States this particular flare up occurred when getting up from sitting for a long period in church yesterday. No known injury.  Pain is severe per patient. Unable to get comfortable. Denies numbness, tingling and has ?weakness due pain.  No fever, chills, loss of control of bowels or bladder. Urinating fine.  No dizziness, chest pain, palpitations, shortness of breath, abdominal pain, N/V/D.   She has been evaluated by Dr. Junius Roads and referred to PT. States she did go to one therapy session.   States she was started on gabapentin for her pain and this is the only oral medication she has been taking.    States she has been using lidocaine patches. Requests a new prescription for patches. Cannot recall who prescribed these for her in the past.   Reviewed allergies, medications, past medical, surgical, family, and social history.    Review of Systems Pertinent positives and negatives in the history of present illness.     Objective:   Physical Exam Constitutional:      General: She is not in acute distress. Cardiovascular:     Rate and Rhythm: Normal rate and regular rhythm.     Pulses: Normal pulses.  Pulmonary:     Effort: Pulmonary effort is normal.     Breath sounds: Normal breath sounds.  Abdominal:     General: Abdomen is flat.     Palpations: Abdomen is soft.  Musculoskeletal:     Cervical back: Normal, normal range of motion and neck supple.     Thoracic back: Normal.     Lumbar back: Tenderness present. Decreased range of motion.     Left upper leg: Tenderness present.     Left knee: Tenderness present over the lateral joint line.   Comments: Significant TTP over left sciatic notch. Unable to perform a good exam due to pain She is quite tender over her left IT band  Skin:    General: Skin is warm and dry.     Capillary Refill: Capillary refill takes less than 2 seconds.  Neurological:     General: No focal deficit present.     Mental Status: She is alert and oriented to person, place, and time.     Cranial Nerves: Cranial nerves are intact.     Sensory: Sensation is intact.     Motor: Motor function is intact.     Gait: Gait normal.    BP 124/80   Pulse 87   Temp (!) 97.3 F (36.3 C)   Wt 161 lb 9.6 oz (73.3 kg)   BMI 26.89 kg/m      Assessment & Plan:  Chronic left-sided low back pain with left-sided sciatica - Plan: diclofenac (VOLTAREN) 75 MG EC tablet, lidocaine (LIDODERM) 5 %, ketorolac (TORADOL) injection 60 mg  She is in significant pain today.  She called and came in for a visit last minute.  I will try and calm down her symptoms with Toradol 60 mg IM injection.  I also prescribed lidocaine patches per patient request and we will try diclofenac twice daily until she can get back into  see her orthopedist, Dr. Prince Rome.  Recommend she continue with physical therapy as recommended.

## 2020-02-25 ENCOUNTER — Telehealth: Payer: Self-pay

## 2020-02-25 NOTE — Telephone Encounter (Signed)
I called pt. LM to use OTC meds instead and asked if she had been scheduled with Dr. Prince Rome yet.

## 2020-02-25 NOTE — Telephone Encounter (Signed)
I recommend she pick up an over the counter pain medication with lidocaine. She can get patches or cream or gel. Please ask her if she has scheduled with Dr. Prince Rome for her pain.

## 2020-02-25 NOTE — Telephone Encounter (Signed)
I submitted a PA for the pts. Lidocaine 5% patches and it was denied by the insurance company. They denied because the pt. Has to have tried and failed Lidocaine 2.5% or 4% solution, lidocaine 2.5% or 3% cream, lidocaine 2% gel, or Capsaicin 0.025% 0.075% or 0.1% cream.

## 2020-02-26 ENCOUNTER — Ambulatory Visit (INDEPENDENT_AMBULATORY_CARE_PROVIDER_SITE_OTHER): Payer: 59

## 2020-02-26 ENCOUNTER — Ambulatory Visit (INDEPENDENT_AMBULATORY_CARE_PROVIDER_SITE_OTHER): Payer: 59 | Admitting: Family Medicine

## 2020-02-26 ENCOUNTER — Other Ambulatory Visit: Payer: Self-pay

## 2020-02-26 ENCOUNTER — Encounter: Payer: Self-pay | Admitting: Family Medicine

## 2020-02-26 DIAGNOSIS — G8929 Other chronic pain: Secondary | ICD-10-CM

## 2020-02-26 DIAGNOSIS — M545 Low back pain, unspecified: Secondary | ICD-10-CM

## 2020-02-26 NOTE — Progress Notes (Signed)
   Office Visit Note   Patient: Nicole Joyce           Date of Birth: [birth year]           MRN: [id number] Visit Date: 02/26/2020 Requested by: Avanell Shackleton, NP-C [address] [place],  [place] [address] PCP: Avanell Shackleton, NP-C  Subjective: Chief Complaint  Patient presents with  . Lower Back - Pain    Persistent low back pain and down the left leg. Hurts to sit with any weight on the left buttock. Saw her PCP on 5/10. Was given a Toradol injection - no help.     HPI: She is here with persistent left-sided sciatica.  No improvement with physical therapy.  Pain is constant, she had a leave work early today.  No incontinence, but she is having increased frequency of bowel movements.  She actually does not mind that because typically she has trouble with constipation.  She cannot find a comfortable position.              ROS: No fevers or chills.  All other systems were reviewed and are negative.  Objective: Vital Signs: There were no vitals taken for this visit.  Physical Exam:  General:  Alert and oriented, in no acute distress. Pulm:  Breathing unlabored. Psy:  Normal mood, congruent affect.  Left leg: She is tender in the sciatic notch.  Slight tenderness over the greater trochanter.  No pain with internal hip rotation.  Straight leg raise is equivocal, lower extremity strength and reflexes are normal.  Imaging: XR Lumbar Spine 2-3 Views  Result Date: 02/26/2020 X-rays lumbar spine: She has mild degenerative disc disease at L4-5 and L5-S1.  No sign of compression deformity or neoplasm.  Hip joints are well-preserved.  No SI joint DJD.  No soft tissue abnormality seen.   Assessment & Plan: 1.  Chronic left-sided sciatica, possibly postherpetic neuralgia but cannot rule out lumbar disc herniation. -MRI to evaluate.  Possible epidural steroid injections if indicated.     Procedures: No procedures performed  No notes on file     PMFS History: Patient Active  Problem List   Diagnosis Date Noted  . Chronic left-sided low back pain with left-sided sciatica 02/03/2020  . Smoker 02/03/2020  . Mild intermittent asthma without complication 02/03/2020  . Bilateral arm pain 02/03/2020  . History of shingles 02/03/2020  . Fall 01/05/2016   Past Medical History:  Diagnosis Date  . Asthma     History reviewed. No pertinent family history.  Past Surgical History:  Procedure Laterality Date  . ROTATOR CUFF REPAIR     Social History   Occupational History  . Not on file  Tobacco Use  . Smoking status: Current Every Day Smoker    Packs/day: 0.50    Types: Cigarettes  . Smokeless tobacco: Never Used  Substance and Sexual Activity  . Alcohol use: No  . Drug use: No  . Sexual activity: Not on file

## 2020-02-27 NOTE — Telephone Encounter (Signed)
Pt. Called back stating she did get some medicine OTC and she saw Dr. Prince Rome yesterday. She stated they did an x-ray and they have her scheduled for an MRI on 03/18/20.

## 2020-03-02 ENCOUNTER — Ambulatory Visit: Payer: 59 | Admitting: Family Medicine

## 2020-03-05 ENCOUNTER — Telehealth: Payer: Self-pay | Admitting: Family Medicine

## 2020-03-05 ENCOUNTER — Ambulatory Visit
Admission: RE | Admit: 2020-03-05 | Discharge: 2020-03-05 | Disposition: A | Discharge status: Home / Self Care | Payer: 59 | Source: Ambulatory Visit | Attending: Family Medicine | Admitting: Family Medicine

## 2020-03-05 ENCOUNTER — Other Ambulatory Visit: Payer: Self-pay

## 2020-03-05 DIAGNOSIS — M48061 Spinal stenosis, lumbar region without neurogenic claudication: Secondary | ICD-10-CM

## 2020-03-05 DIAGNOSIS — M545 Low back pain, unspecified: Secondary | ICD-10-CM

## 2020-03-05 NOTE — Telephone Encounter (Signed)
Lumbar MRI scan shows quite a bit of arthritis in the facet joints on the back of the spine at L3-4 and L4-5 levels.  Combined with disc bulging, this causes significant narrowing/stenosis of the spinal canal and the nerve openings at these levels.  I recommend trying an epidural steroid injection to see if symptoms will improve.  If conservative treatments fail, then surgical consult.

## 2020-03-06 ENCOUNTER — Encounter: Payer: 59 | Admitting: Physical Therapy

## 2020-03-06 ENCOUNTER — Telehealth: Payer: Self-pay | Admitting: Physical Therapy

## 2020-03-06 NOTE — Telephone Encounter (Signed)
Spoke with pt regarding missed appointment. She stated she thought the appointment was Monday. Discussed that her next appointment on Tuesday at 4:00. She stated she would be here for the next visit unless she see's her MD on Monday for an epidural. She states she will call and let us know if she cannot make it.   Niko Penson PT, DPT, LAT, ATC  03/06/20  3:52 PM

## 2020-03-10 ENCOUNTER — Ambulatory Visit (INDEPENDENT_AMBULATORY_CARE_PROVIDER_SITE_OTHER): Payer: 59 | Admitting: Physical Therapy

## 2020-03-10 ENCOUNTER — Encounter: Payer: Self-pay | Admitting: Physical Therapy

## 2020-03-10 ENCOUNTER — Other Ambulatory Visit: Payer: Self-pay

## 2020-03-10 DIAGNOSIS — M25512 Pain in left shoulder: Secondary | ICD-10-CM

## 2020-03-10 DIAGNOSIS — M545 Low back pain, unspecified: Secondary | ICD-10-CM

## 2020-03-10 DIAGNOSIS — M542 Cervicalgia: Secondary | ICD-10-CM | POA: Diagnosis not present

## 2020-03-10 DIAGNOSIS — M6281 Muscle weakness (generalized): Secondary | ICD-10-CM

## 2020-03-10 DIAGNOSIS — G8929 Other chronic pain: Secondary | ICD-10-CM

## 2020-03-10 NOTE — Therapy (Addendum)
[facility] Physical Therapy [address] [place], [place], [address] Phone: [phone]   Fax:  [phone]  Physical Therapy Treatment/Discharge addendum PHYSICAL THERAPY DISCHARGE SUMMARY  Visits from Start of Care: 2  Current functional level related to goals / functional outcomes: See below   Remaining deficits: See below   Education / Equipment: HEP Plan: Patient agrees to discharge.  Patient goals were not met. Patient is being discharged due to not returning since the last visit.  ?????  Elsie Ra, PT, DPT 05/26/20 8:24 AM       Patient Details  Name: Nicole Joyce MRN: [id number] Date of Birth: [birth year] Referring Provider (PT): Hilts, MD   Encounter Date: 03/10/2020  PT End of Session - 03/10/20 1643    Visit Number  2    Number of Visits  12    Date for PT Re-Evaluation  04/02/20    Authorization Type  [employer]    PT Start Time  1605    PT Stop Time  1650    PT Time Calculation (min)  45 min    Activity Tolerance  Patient tolerated treatment well    Behavior During Therapy  [facility] for tasks assessed/performed       Past Medical History:  Diagnosis Date  . Asthma     Past Surgical History:  Procedure Laterality Date  . ROTATOR CUFF REPAIR      There were no vitals filed for this visit.  Subjective Assessment - 03/10/20 1615    Subjective  She relays overall the pain is better about 2/10 today for her back and neck. She says the heat and TENs really helped last time. She does relay some N/T in her Lt hand first 2 finger. She also had lumbar MRI recently.    Pertinent History  PMH: fall, chronic LBP, shingles, asthma    Limitations  Lifting;Standing;House hold activities    How long can you stand comfortably?  15 min    How long can you walk comfortably?  2 hours    Diagnostic tests  neck MRI in 2017 showing stenosis and disc bulge, Lumbar MRI "Advanced facet osteoarthritis and generalized disc bulging withL3-4  anterolisthesis.L4-5 high-grade spinal stenosis with asymmetric left subarticularrecess impingement on L5. Moderate biforaminal impingement"    Pain Onset  More than a month ago    Pain Onset  More than a month ago                        [facility] Adult PT Treatment/Exercise - 03/10/20 0001      Exercises   Exercises  Other Exercises    Other Exercises   Nu step for LE/UE ROM and mobility L5 X 7 minutes with heat to her back      Moist Heat Therapy   Number Minutes Moist Heat  10 Minutes    Moist Heat Location  Cervical      Electrical Stimulation   Electrical Stimulation Location  neck    Electrical Stimulation Action  IFC with heat 10 min    Electrical Stimulation Parameters  to tolerance in sitting    Electrical Stimulation Goals  Pain;Tone                  PT Long Term Goals - 02/20/20 0800      PT LONG TERM GOAL #1   Title  upon discharge pt will be I with all HEP given throughout therapy (target for all goals 6  weeks 04/02/20))    Period  Weeks    Status  New      PT LONG TERM GOAL #2   Title  pt will increase neck and shoulder AROM to [facility] >60% available    Time  6    Period  Weeks    Status  New      PT LONG TERM GOAL #3   Title  pt will demonstrate increased UE/LE strength to 4+/5 in all planes    Time  6    Period  Weeks    Status  Achieved      PT LONG TERM GOAL #4   Title  She will reduce overall pain to less than 3-4/10 with usual activity and sleeping    Time  6    Period  Weeks    Status  New      PT LONG TERM GOAL #5   Title  -            Plan - 03/10/20 1643    Clinical Impression Statement  She reports compliance with HEP and that oveall the exercises are really helping. Since this is working for her at home session focused on using equipment available at PT clinic including Nu step for mobilty, trial of mechanical traction for cervical decompression and radiculopathy, and TENS for overall pain and tone relief. She  does have new lumbar MRI since she was in PT last, see chart for details.    Personal Factors and Comorbidities  Comorbidity 3+    Comorbidities  PMH: fall, chronic LBP, shingles, asthma    Examination-Activity Limitations  Carry;Stand;Lift;Squat;Sleep;Locomotion Level    Examination-Participation Restrictions  Investment banker, operational;Shop    Stability/Clinical Decision Making  Evolving/Moderate complexity    Rehab Potential  Good    PT Frequency  2x / week   1-2   PT Treatment/Interventions  ADLs/Self Care Home Management;Cryotherapy;Electrical Stimulation;Iontophoresis 64m/ml Dexamethasone;Moist Heat;Traction;Ultrasound;Gait training;Therapeutic activities;Therapeutic exercise;Balance training;Neuromuscular re-education;Manual techniques;Passive range of motion;Dry needling;Joint Manipulations;Spinal Manipulations;Taping    PT Next Visit Plan  review and update HEP PRN, graded exercise, consider modalaties or DN for pain    PT Home Exercise Plan  Access Code: [email or web address]      Patient will benefit from skilled therapeutic intervention in order to improve the following deficits and impairments:  Decreased activity tolerance, Decreased mobility, Decreased range of motion, Decreased strength, Hypomobility, Difficulty walking, Increased muscle spasms, Impaired flexibility, Postural dysfunction, Pain, Improper body mechanics  Visit Diagnosis: Cervicalgia  Chronic left-sided low back pain, unspecified whether sciatica present  Muscle weakness (generalized)  Chronic left shoulder pain     Problem List Patient Active Problem List   Diagnosis Date Noted  . Chronic left-sided low back pain with left-sided sciatica 02/03/2020  . Smoker 02/03/2020  . Mild intermittent asthma without complication 063/84/5364 . Bilateral arm pain 02/03/2020  . History of shingles 02/03/2020  . Fall 01/05/2016    BSilvestre Mesi5/25/2021, 4:48 PM  [facility] [address] [address]Phone: [phone]  Fax:  [phone] Name: CRutha MelgozaMRN: [id number] of Birth: 805-19-1957

## 2020-03-17 ENCOUNTER — Telehealth: Payer: Self-pay | Admitting: Rehabilitative and Restorative Service Providers"

## 2020-03-17 ENCOUNTER — Encounter: Payer: 59 | Admitting: Rehabilitative and Restorative Service Providers"

## 2020-03-17 NOTE — Telephone Encounter (Signed)
Called and left voice message requested call back to clinic to confirm next appointment.  Chyrel Masson, PT, DPT, OCS, ATC 03/17/20  4:24 PM

## 2020-03-20 ENCOUNTER — Other Ambulatory Visit: Payer: 59

## 2020-03-24 ENCOUNTER — Encounter: Payer: 59 | Admitting: Rehabilitative and Restorative Service Providers"

## 2020-05-19 ENCOUNTER — Encounter: Payer: Self-pay | Admitting: Physical Medicine and Rehabilitation

## 2020-05-19 ENCOUNTER — Ambulatory Visit: Payer: Self-pay

## 2020-05-19 ENCOUNTER — Encounter: Payer: Self-pay | Admitting: Internal Medicine

## 2020-05-19 ENCOUNTER — Ambulatory Visit: Payer: Medicare HMO | Admitting: Physical Medicine and Rehabilitation

## 2020-05-19 ENCOUNTER — Other Ambulatory Visit: Payer: Self-pay

## 2020-05-19 VITALS — BP 133/88 | HR 88

## 2020-05-19 DIAGNOSIS — M5416 Radiculopathy, lumbar region: Secondary | ICD-10-CM

## 2020-05-19 DIAGNOSIS — M48062 Spinal stenosis, lumbar region with neurogenic claudication: Secondary | ICD-10-CM

## 2020-05-19 MED ORDER — METHYLPREDNISOLONE ACETATE 80 MG/ML IJ SUSP
80.0000 mg | Freq: Once | INTRAMUSCULAR | Status: AC
  Administered 2020-05-19: 80 mg

## 2020-05-19 NOTE — Progress Notes (Signed)
Nicole Joyce - 64 y.o. female MRN [id number]  Date of birth: 12-04-1955  Office Visit Note: Visit Date: 05/19/2020 PCP: Avanell Shackleton, NP-C Referred by: Avanell Shackleton, NP-C  Subjective: Chief Complaint  Patient presents with  . Left Ankle - Pain  . Left Knee - Pain  . Lower Back - Pain  . Left Leg - Pain   HPI:  Nicole Joyce is a 64 y.o. female who comes in today at the request of Dr. Lavada Mesi for planned Left L4-L5 and L5-S1 Lumbar epidural steroid injection with fluoroscopic guidance.  The patient has failed conservative care including home exercise, medications, time and activity modification.  This injection will be diagnostic and hopefully therapeutic.  Please see requesting physician notes for further details and justification.  MRI reviewed with images and spine model.  MRI reviewed in the note below.    ROS Otherwise per HPI.  Assessment & Plan: Visit Diagnoses:  1. Lumbar radiculopathy   2. Spinal stenosis of lumbar region with neurogenic claudication     Plan: No additional findings.   Meds & Orders:  Meds ordered this encounter  Medications  . methylPREDNISolone acetate (DEPO-MEDROL) injection 80 mg    Orders Placed This Encounter  Procedures  . XR C-ARM NO REPORT  . Epidural Steroid injection    Follow-up: Return if symptoms worsen or fail to improve.   Procedures: No procedures performed  Lumbosacral Transforaminal Epidural Steroid Injection - Sub-Pedicular Approach with Fluoroscopic Guidance  Patient: Nicole Joyce      Date of Birth: 09-Apr-1956 MRN: [id number] PCP: Avanell Shackleton, NP-C      Visit Date: 05/19/2020   Universal Protocol:    Date/Time: 05/19/2020  Consent Given By: the patient  Position: PRONE  Additional Comments: Vital signs were monitored before and after the procedure. Patient was prepped and draped in the usual sterile fashion. The correct patient, procedure, and site was verified.   Injection Procedure  Details:  Procedure Site One Meds Administered:  Meds ordered this encounter  Medications  . methylPREDNISolone acetate (DEPO-MEDROL) injection 80 mg    Laterality: Left  Location/Site:  L4-L5 L5-S1  Needle size: 22 G  Needle type: Spinal  Needle Placement: Transforaminal  Findings:    -Comments: Excellent flow of contrast along the nerve, nerve root and into the epidural space.  Procedure Details: After squaring off the end-plates to get a true AP view, the C-arm was positioned so that an oblique view of the foramen as noted above was visualized. The target area is just inferior to the "nose of the scotty dog" or sub pedicular. The soft tissues overlying this structure were infiltrated with 2-3 ml. of 1% Lidocaine without Epinephrine.  The spinal needle was inserted toward the target using a "trajectory" view along the fluoroscope beam.  Under AP and lateral visualization, the needle was advanced so it did not puncture dura and was located close the 6 O'Clock position of the pedical in AP tracterory. Biplanar projections were used to confirm position. Aspiration was confirmed to be negative for CSF and/or blood. A 1-2 ml. volume of Isovue-250 was injected and flow of contrast was noted at each level. Radiographs were obtained for documentation purposes.   After attaining the desired flow of contrast documented above, a 0.5 to 1.0 ml test dose of 0.25% Marcaine was injected into each respective transforaminal space.  The patient was observed for 90 seconds post injection.  After no sensory deficits were reported, and normal lower extremity  motor function was noted,   the above injectate was administered so that equal amounts of the injectate were placed at each foramen (level) into the transforaminal epidural space.   Additional Comments:  The patient tolerated the procedure well Dressing: 2 x 2 sterile gauze and Band-Aid    Post-procedure details: Patient was observed during  the procedure. Post-procedure instructions were reviewed.  Patient left the clinic in stable condition.      Clinical History: CLINICAL DATA:  Lumbar radiculopathy on the left  EXAM: MRI LUMBAR SPINE WITHOUT CONTRAST  TECHNIQUE: Multiplanar, multisequence MR imaging of the lumbar spine was performed. No intravenous contrast was administered.  COMPARISON:  Radiography from 8 days ago  FINDINGS: Segmentation:  5 lumbar type vertebrae by prior radiography.  Alignment:  Grade 1 facet mediated anterolisthesis at L3-4  Vertebrae: Marrow edema about the left more than right L4-5 facets. Borderline right-sided marrow edema at L3-4. No evidence of fracture or aggressive bone lesion. Subchondral hypointense structure in the right ilium that is benign when compared with 2012 abdominal CT.  Conus medullaris and cauda equina: Conus extends to the L1-2 level. Conus and cauda equina appear normal.  Paraspinal and other soft tissues: Negative  Disc levels:  T11-12: Anterior disc narrowing and spondylosis. There is a small central disc protrusion without neural compression. Disc annular fissuring  T12- L1: Unremarkable.  L1-L2: Mild foraminal disc bulging and endplate spurring. Mild facet spurring asymmetric to the left.  L2-L3: Degenerative facet spurring and ligamentum flavum thickening. There is foraminal predominant disc bulging without high-grade stenosis  L3-L4: Facet osteoarthritis with spurring and mild anterolisthesis. The disc is bulging and there is a left foraminal protrusion impinging on the L3 nerve root. Spinal stenosis is moderate. Right foraminal stenosis is moderate.  L4-L5: Facet osteoarthritis with spurring and ligamentum flavum hypertrophy. The disc is circumferentially bulging and there is moderate to advanced spinal stenosis with asymmetric left subarticular recess narrowing. Moderate left more than right foraminal  impingement  L5-S1:Facet spurring that is bulky on the right. No herniation or impingement.  IMPRESSION: 1. Advanced facet osteoarthritis and generalized disc bulging with L3-4 anterolisthesis. Active facet arthritis is present at L4-5, especially on the left. 2. L4-5 high-grade spinal stenosis with asymmetric left subarticular recess impingement on L5. Moderate biforaminal impingement. 3. L3-4 moderate spinal stenosis and biforaminal impingement.   Electronically Signed   By: Marnee Spring M.D.   On: 03/05/2020 08:56     Objective:  VS:  HT:    WT:   BMI:     BP:133/88  HR:88bpm  TEMP: ( )  RESP:  Physical Exam Constitutional:      General: She is not in acute distress.    Appearance: Normal appearance. She is not ill-appearing.  HENT:     Head: Normocephalic and atraumatic.     Right Ear: External ear normal.     Left Ear: External ear normal.  Eyes:     Extraocular Movements: Extraocular movements intact.  Cardiovascular:     Rate and Rhythm: Normal rate.     Pulses: Normal pulses.  Musculoskeletal:     Right lower leg: No edema.     Left lower leg: No edema.     Comments: Patient has good distal strength with no pain over the greater trochanters.  No clonus or focal weakness.  Skin:    Findings: No erythema, lesion or rash.  Neurological:     General: No focal deficit present.     Mental Status:  She is alert and oriented to person, place, and time.     Sensory: No sensory deficit.     Motor: No weakness or abnormal muscle tone.     Coordination: Coordination normal.  Psychiatric:        Mood and Affect: Mood normal.        Behavior: Behavior normal.      Imaging: No results found.

## 2020-05-19 NOTE — Progress Notes (Signed)
Pt states left lower back that travels to her knee to the ankle on the left side. Pt states driving makes the pain worse. Pt states the more to move around helps with the pain.   Numeric Pain Rating Scale and Functional Assessment Average Pain 6   In the last MONTH (on 0-10 scale) has pain interfered with the following?  1. General activity like being  able to carry out your everyday physical activities such as walking, climbing stairs, carrying groceries, or moving a chair?  Rating(2)   +Driver, -BT, -Dye Allergies.

## 2020-05-19 NOTE — Procedures (Signed)
Lumbosacral Transforaminal Epidural Steroid Injection - Sub-Pedicular Approach with Fluoroscopic Guidance  Patient: Nicole Joyce      Date of Birth: 09/09/1956 MRN: [id number] PCP: Avanell Shackleton, NP-C      Visit Date: 05/19/2020   Universal Protocol:    Date/Time: 05/19/2020  Consent Given By: the patient  Position: PRONE  Additional Comments: Vital signs were monitored before and after the procedure. Patient was prepped and draped in the usual sterile fashion. The correct patient, procedure, and site was verified.   Injection Procedure Details:  Procedure Site One Meds Administered:  Meds ordered this encounter  Medications   methylPREDNISolone acetate (DEPO-MEDROL) injection 80 mg    Laterality: Left  Location/Site:  L4-L5 L5-S1  Needle size: 22 G  Needle type: Spinal  Needle Placement: Transforaminal  Findings:    -Comments: Excellent flow of contrast along the nerve, nerve root and into the epidural space.  Procedure Details: After squaring off the end-plates to get a true AP view, the C-arm was positioned so that an oblique view of the foramen as noted above was visualized. The target area is just inferior to the "nose of the scotty dog" or sub pedicular. The soft tissues overlying this structure were infiltrated with 2-3 ml. of 1% Lidocaine without Epinephrine.  The spinal needle was inserted toward the target using a "trajectory" view along the fluoroscope beam.  Under AP and lateral visualization, the needle was advanced so it did not puncture dura and was located close the 6 O'Clock position of the pedical in AP tracterory. Biplanar projections were used to confirm position. Aspiration was confirmed to be negative for CSF and/or blood. A 1-2 ml. volume of Isovue-250 was injected and flow of contrast was noted at each level. Radiographs were obtained for documentation purposes.   After attaining the desired flow of contrast documented above, a 0.5 to 1.0 ml  test dose of 0.25% Marcaine was injected into each respective transforaminal space.  The patient was observed for 90 seconds post injection.  After no sensory deficits were reported, and normal lower extremity motor function was noted,   the above injectate was administered so that equal amounts of the injectate were placed at each foramen (level) into the transforaminal epidural space.   Additional Comments:  The patient tolerated the procedure well Dressing: 2 x 2 sterile gauze and Band-Aid    Post-procedure details: Patient was observed during the procedure. Post-procedure instructions were reviewed.  Patient left the clinic in stable condition.

## 2020-06-05 ENCOUNTER — Encounter: Payer: Self-pay | Admitting: Family Medicine

## 2020-06-18 ENCOUNTER — Encounter: Payer: Self-pay | Admitting: Physical Medicine and Rehabilitation

## 2020-07-15 ENCOUNTER — Encounter: Payer: Self-pay | Admitting: Family Medicine

## 2020-09-09 ENCOUNTER — Other Ambulatory Visit: Payer: Self-pay

## 2020-09-09 ENCOUNTER — Encounter: Payer: Self-pay | Admitting: Family Medicine

## 2020-09-09 ENCOUNTER — Ambulatory Visit (INDEPENDENT_AMBULATORY_CARE_PROVIDER_SITE_OTHER): Payer: Medicare HMO | Admitting: Family Medicine

## 2020-09-09 VITALS — BP 120/80 | HR 99 | Temp 98.3°F | Wt 151.4 lb

## 2020-09-09 DIAGNOSIS — G8929 Other chronic pain: Secondary | ICD-10-CM

## 2020-09-09 DIAGNOSIS — M5416 Radiculopathy, lumbar region: Secondary | ICD-10-CM | POA: Diagnosis not present

## 2020-09-09 DIAGNOSIS — M5442 Lumbago with sciatica, left side: Secondary | ICD-10-CM

## 2020-09-09 MED ORDER — DICLOFENAC SODIUM 75 MG PO TBEC: 75.0000 mg | DELAYED_RELEASE_TABLET | Freq: Two times a day (BID) | ORAL | 0 refills | Status: DC

## 2020-09-09 MED ORDER — LIDOCAINE 5 % EX PTCH: 1.0000 | MEDICATED_PATCH | CUTANEOUS | 0 refills | Status: DC

## 2020-09-09 MED ORDER — KETOROLAC TROMETHAMINE 60 MG/2ML IM SOLN
60.0000 mg | Freq: Once | INTRAMUSCULAR | Status: AC
  Administered 2020-09-09: 60 mg via INTRAMUSCULAR

## 2020-09-09 NOTE — Progress Notes (Signed)
Subjective:    Patient ID: Nicole Joyce, female    DOB: Jun 22, 1956, 64 y.o.   MRN: [id number]  HPI Chief Complaint  Patient presents with  . hip pain    hip pain since april, can't walk, sit on it.    She is here with complaints of worsening left low back pain for the past 6 days.  States she has basically been in bed for the past 3 days.  States pain is in the left buttocks radiating down to her posterior ankle.  She also reports numbness and tingling intermittently in certain positions. Laying on her left side is the only position that is somewhat comfortable.   States she has been taking Tylenol extra strength and using a heating pad.  States she is out of lidocaine patches and diclofenac  History of lumbar radiculopathy and spinal stenosis of lumbar region.  Lumbar epidural steroid injection done in August by Dr. Alvester Morin.  She has seen Dr. Prince Rome for this issue as well.   Denies fever, chills, chest pain, palpitations, shortness of breath, abdominal pain, nausea, vomiting or diarrhea.  Denies loss of control of bowels or bladder.   Review of Systems Pertinent positives and negatives in the history of present illness.     Objective:   Physical Exam Constitutional:      General: She is in acute distress.     Appearance: Normal appearance.  Eyes:     Extraocular Movements: Extraocular movements intact.     Conjunctiva/sclera: Conjunctivae normal.     Pupils: Pupils are equal, round, and reactive to light.  Cardiovascular:     Rate and Rhythm: Normal rate.     Pulses: Normal pulses.  Pulmonary:     Effort: Pulmonary effort is normal.  Musculoskeletal:     Cervical back: Normal, normal range of motion and neck supple.     Thoracic back: Normal.     Lumbar back: Decreased range of motion. Negative right straight leg raise test and negative left straight leg raise test.       Back:     Comments: TTP over her left sciatic notch. Numbness and tingling reproduced. Left leg  weakness compared to right. Normal sensation and movement of left leg.   Skin:    General: Skin is warm and dry.     Capillary Refill: Capillary refill takes less than 2 seconds.  Neurological:     General: No focal deficit present.     Mental Status: She is alert and oriented to person, place, and time.     Cranial Nerves: No cranial nerve deficit.     Sensory: No sensory deficit.     Gait: Gait normal.     Deep Tendon Reflexes: Reflexes normal.    BP 120/80   Pulse 99   Temp 98.3 F (36.8 C)   Wt 151 lb 6.4 oz (68.7 kg)   BMI 25.19 kg/m         Assessment & Plan:  Chronic left-sided low back pain with left-sided sciatica - Plan: lidocaine (LIDODERM) 5 %, diclofenac (VOLTAREN) 75 MG EC tablet, ketorolac (TORADOL) injection 60 mg  Lumbar radiculopathy  This is a chronic issue with an acute flare.  No red flag symptoms.  She has not contacted her orthopedist regarding her current symptoms.  Reports she is thinking about having surgery for her issues. She was given 60 mg of Toradol IM in the office.  I will refill diclofenac and lidocaine patches.  She will continue  using a heating pad or ice.  She will call when she leaves here and schedule an appointment with her orthopedist.  If she is getting much worse over the weekend she will have to go to the emergency department or urgent care.

## 2020-09-13 ENCOUNTER — Telehealth: Payer: Self-pay

## 2020-09-13 NOTE — Telephone Encounter (Signed)
P.AArrie Aran PATCH

## 2020-09-18 NOTE — Telephone Encounter (Signed)
[employer] denied lidocaine patches are not covered for Lumbago with sciatica.  Can appeal or try Good RX discount card $47 at [employer].  Will call patient

## 2020-09-22 NOTE — Telephone Encounter (Signed)
Pt informed, she said she was better after shot & will let us know if she needs them

## 2020-09-22 NOTE — Telephone Encounter (Signed)
Left message for pt

## 2020-09-24 ENCOUNTER — Encounter: Payer: Self-pay | Admitting: Family Medicine

## 2020-09-24 ENCOUNTER — Other Ambulatory Visit (INDEPENDENT_AMBULATORY_CARE_PROVIDER_SITE_OTHER): Payer: Medicare HMO

## 2020-09-24 ENCOUNTER — Other Ambulatory Visit: Payer: Self-pay

## 2020-09-24 ENCOUNTER — Telehealth (INDEPENDENT_AMBULATORY_CARE_PROVIDER_SITE_OTHER): Payer: Medicare HMO | Admitting: Family Medicine

## 2020-09-24 VITALS — Temp 97.3°F | Ht 65.0 in | Wt 157.0 lb

## 2020-09-24 DIAGNOSIS — R0989 Other specified symptoms and signs involving the circulatory and respiratory systems: Secondary | ICD-10-CM | POA: Diagnosis not present

## 2020-09-24 DIAGNOSIS — F17200 Nicotine dependence, unspecified, uncomplicated: Secondary | ICD-10-CM

## 2020-09-24 DIAGNOSIS — R059 Cough, unspecified: Secondary | ICD-10-CM | POA: Diagnosis not present

## 2020-09-24 DIAGNOSIS — J069 Acute upper respiratory infection, unspecified: Secondary | ICD-10-CM

## 2020-09-24 DIAGNOSIS — J4520 Mild intermittent asthma, uncomplicated: Secondary | ICD-10-CM

## 2020-09-24 LAB — POC COVID19 BINAXNOW: SARS Coronavirus 2 Ag: NEGATIVE

## 2020-09-24 NOTE — Patient Instructions (Signed)
Drink plenty of water. Use claritin or allegra or zyrtec once daily (antihistamine to treat allergies, runny nose/sneezing, etc). Use Mucinex regularly (guaifenesin) to help loosen up your chest. Continue to use your albuterol as needed for any shortness of breath/tightness/wheezing.  If you develop fever, if your mucus or phlegm changes to yellow-green (no longer clear or white) this could be a sign of infection that needs an antibiotic. Follow up with [place] if you are getting worse, developing shortness of breath, or other new/worsening symptoms.  Please work on quitting smoking!!

## 2020-09-24 NOTE — Progress Notes (Signed)
Start time: 2:00 End time: 2:21  Virtual Visit via Video Note  I connected with Nicole Joyce on 09/24/20 by a video enabled telemedicine application and verified that I am speaking with the correct person using two identifiers.  Location: Patient: home Provider: office   I discussed the limitations of evaluation and management by telemedicine and the availability of in person appointments. The patient expressed understanding and agreed to proceed.  History of Present Illness:  Chief Complaint  Patient presents with  . Nasal Congestion    VIRTUAL and runny nose, stopped up for 5 days and moving into her chest.     She has felt bad since 11/24, thought it was related to the cortisone shot she got. 5 days ago her nose was completely stopped up, had runny eyes, nose. She had pressure above both her eyes. She took ibuprofen and an allergy medicine the first night (25 benadryl). Mucus from the nose was clear.  She has been sneezing for the last 3 days.  Since her head opened up, the sneezing stopped, less runny nose, but is now in her chest. Yesterday when breathing in at work she felt it tight in her chest. Last night after work she felt weak, tired, went to bed early.  Last night she put a heating pad on her back, had some discomfort when she breathed in.   Head and nose feel better.  2 days ago she started using albuterol, and today it feels a little looser. She had some PND at first, not anymore.  Now has a wet cough (loosened up by the inhaler)--it is thin, clear in nature. Not coughing much.  Denies shortness of breath or any significant pain today.   She reports she gets this every November/December.  She was told in the past to take Mucinex for this.  She didn't have this happen in the last year or two, so forgot about it, until someone suggested it to her today.  Works in [employer].  2 sick kids last week, one of whom sneezed while eating (both unmasked).  She reports having gotten  COVID vaccines, no booster yet. Second was in March. She needs to get her card--(got through health department in [facility]).   PMH, PSH, SH reviewed  Patient is a smoker, though has cut back some during this illness.  She also has h/o asthma  Outpatient Encounter Medications as of 09/24/2020  Medication Sig  . albuterol (PROVENTIL HFA;VENTOLIN HFA) 108 (90 BASE) MCG/ACT inhaler Inhale 2 puffs into the lungs every 6 (six) hours as needed. For shortness of breath  . diclofenac (VOLTAREN) 75 MG EC tablet Take 1 tablet (75 mg total) by mouth 2 (two) times daily.  . DULoxetine (CYMBALTA) 30 MG capsule Take 1 capsule (30 mg total) by mouth daily. (Patient not taking: No sig reported)  . gabapentin (NEURONTIN) 100 MG capsule 1 PO q HS, may increase to 1 PO TID if needed (Patient not taking: No sig reported)  . lidocaine (LIDODERM) 5 % Place 1 patch onto the skin daily. Remove & Discard patch within 12 hours or as directed by MD (Patient not taking: Reported on 09/24/2020)   No facility-administered encounter medications on file as of 09/24/2020.   No Known Allergies  ROS: no fever, chills.  Head pressure/congestion resolved.  +chest congestion, occasional cough.  No shortness of breath.  No GI complaints, dizziness, rash or other complaints, except as noted in HPI.    Observations/Objective:  Temp (!) 97.3 F (  36.3 C) (Temporal)   Ht 5\' 5"  (1.651 m)   Wt 157 lb (71.2 kg)   BMI 26.13 kg/m   Pleasant, well-appearing female, in no distress. She is speaking easily, in no distress.  Once heard a wet-sounding, loose cough. She is alert and oriented.  Cranial nerves grossly intact. Exam is limited due to virtual nature of the visit.  Assessment and Plan:  Chest congestion - start mucinex. Cont albuterol as needed  Mild intermittent asthma without complication - albuterol prn  Viral upper respiratory illness - supportive measures and s/sx bacterial infection reviewed  Smoker - cessation  encouraged  COVID test today.  Drink plenty of water. To use claritin or allegra or zyrtec once daily. Use Mucinex regularly (guaifenesin) to help loosen up your chest. Continue to use your albuterol as needed for any shortness of breath/tightness/wheezing.  If you develop fever, if your mucus or phlegm changes to yellow-green (no longer clear or white) this could be a sign of infection that needs an antibiotic.  Please work on quitting smoking!!   Follow Up Instructions:    I discussed the assessment and treatment plan with the patient. The patient was provided an opportunity to ask questions and all were answered. The patient agreed with the plan and demonstrated an understanding of the instructions.   The patient was advised to call back or seek an in-person evaluation if the symptoms worsen or if the condition fails to improve as anticipated.  I spent 25 minutes dedicated to the care of this patient, including pre-visit review of records, face to face time, post-visit ordering of testing and documentation.    , MD

## 2020-09-25 LAB — SARS-COV-2, NAA 2 DAY TAT

## 2020-09-25 LAB — NOVEL CORONAVIRUS, NAA: SARS-CoV-2, NAA: DETECTED — AB

## 2020-09-25 NOTE — Progress Notes (Signed)
Found vaccine dates online and will mail patient new card.   Also called pt

## 2020-09-25 NOTE — Progress Notes (Signed)
Pt will call back to schedule booster when feeling better

## 2020-09-28 ENCOUNTER — Encounter: Payer: Self-pay | Admitting: Internal Medicine

## 2020-10-18 DIAGNOSIS — H524 Presbyopia: Secondary | ICD-10-CM | POA: Diagnosis not present

## 2020-11-18 ENCOUNTER — Ambulatory Visit
Admission: RE | Admit: 2020-11-18 | Discharge: 2020-11-18 | Disposition: A | Discharge status: Home / Self Care | Payer: Medicare HMO | Source: Ambulatory Visit | Attending: Family Medicine | Admitting: Family Medicine

## 2020-11-18 ENCOUNTER — Other Ambulatory Visit: Payer: Self-pay

## 2020-11-18 ENCOUNTER — Ambulatory Visit (INDEPENDENT_AMBULATORY_CARE_PROVIDER_SITE_OTHER): Payer: Medicare Other | Admitting: Family Medicine

## 2020-11-18 ENCOUNTER — Encounter: Payer: Self-pay | Admitting: Family Medicine

## 2020-11-18 ENCOUNTER — Other Ambulatory Visit (HOSPITAL_COMMUNITY)
Admission: RE | Admit: 2020-11-18 | Discharge: 2020-11-18 | Disposition: A | Discharge status: Home / Self Care | Payer: Medicare Other | Source: Ambulatory Visit | Attending: Family Medicine | Admitting: Family Medicine

## 2020-11-18 VITALS — BP 120/70 | HR 80 | Ht 64.5 in | Wt 157.0 lb

## 2020-11-18 DIAGNOSIS — Z8742 Personal history of other diseases of the female genital tract: Secondary | ICD-10-CM

## 2020-11-18 DIAGNOSIS — Z13220 Encounter for screening for lipoid disorders: Secondary | ICD-10-CM | POA: Diagnosis not present

## 2020-11-18 DIAGNOSIS — F17200 Nicotine dependence, unspecified, uncomplicated: Secondary | ICD-10-CM | POA: Diagnosis not present

## 2020-11-18 DIAGNOSIS — J4520 Mild intermittent asthma, uncomplicated: Secondary | ICD-10-CM | POA: Diagnosis not present

## 2020-11-18 DIAGNOSIS — R102 Pelvic and perineal pain: Secondary | ICD-10-CM

## 2020-11-18 DIAGNOSIS — Z124 Encounter for screening for malignant neoplasm of cervix: Secondary | ICD-10-CM

## 2020-11-18 DIAGNOSIS — N941 Unspecified dyspareunia: Secondary | ICD-10-CM

## 2020-11-18 DIAGNOSIS — Z114 Encounter for screening for human immunodeficiency virus [HIV]: Secondary | ICD-10-CM

## 2020-11-18 DIAGNOSIS — Z1159 Encounter for screening for other viral diseases: Secondary | ICD-10-CM

## 2020-11-18 DIAGNOSIS — K5909 Other constipation: Secondary | ICD-10-CM

## 2020-11-18 DIAGNOSIS — Z0000 Encounter for general adult medical examination without abnormal findings: Secondary | ICD-10-CM

## 2020-11-18 DIAGNOSIS — R10819 Abdominal tenderness, unspecified site: Secondary | ICD-10-CM | POA: Diagnosis not present

## 2020-11-18 DIAGNOSIS — E2839 Other primary ovarian failure: Secondary | ICD-10-CM | POA: Diagnosis not present

## 2020-11-18 DIAGNOSIS — Z1239 Encounter for other screening for malignant neoplasm of breast: Secondary | ICD-10-CM

## 2020-11-18 DIAGNOSIS — Z8709 Personal history of other diseases of the respiratory system: Secondary | ICD-10-CM | POA: Diagnosis not present

## 2020-11-18 DIAGNOSIS — R0989 Other specified symptoms and signs involving the circulatory and respiratory systems: Secondary | ICD-10-CM

## 2020-11-18 HISTORY — DX: Other primary ovarian failure: E28.39

## 2020-11-18 HISTORY — DX: Personal history of other diseases of the female genital tract: Z87.42

## 2020-11-18 HISTORY — DX: Other specified symptoms and signs involving the circulatory and respiratory systems: R09.89

## 2020-11-18 LAB — COMPREHENSIVE METABOLIC PANEL

## 2020-11-18 LAB — CBC WITH DIFFERENTIAL/PLATELET
Eos: 0 %
Hematocrit: 38.8 % (ref 34.0–46.6)
Hemoglobin: 13 g/dL (ref 11.1–15.9)
MCHC: 33.5 g/dL (ref 31.5–35.7)
Neutrophils Absolute: 2.7 10*3/uL (ref 1.4–7.0)

## 2020-11-18 LAB — T4, FREE

## 2020-11-18 NOTE — Patient Instructions (Signed)
  Ms. Galloway , Thank you for taking time to come for your Medicare Wellness Visit. I appreciate your ongoing commitment to your health goals. Please review the following plan we discussed and let me know if I can assist you in the future.   These are the goals we discussed:  Go to [facility] to get your chest X ray.   Call [facility] to schedule your mammogram and bone density.   I am sending you for a pelvic ultrasound.   Call and schedule regular dental and eye exams.   I strongly encourage you to stop smoking (everything).   Fill out the advance directive paperwork as we discussed and then get it notarized.   Try a probiotic for constipation for 4 weeks. You can also take stool softeners. Increase your water intake.   I will be in touch with your results.       This is a list of the screening recommended for you and due dates:  Health Maintenance  Topic Date Due  .  Hepatitis C: One time screening is recommended by Center for Disease Control  (CDC) for  adults born from [birth year] through 1966.   Never done  . Tetanus Vaccine  Never done  . Mammogram  11/11/2018  . Pap Smear  07/26/2019  . Flu Shot  Never done  . DEXA scan (bone density measurement)  Never done  . Pneumonia vaccines (1 of 2 - PCV13) Never done  . COVID-19 Vaccine (3 - Booster for Pfizer series) 07/06/2020  . Colon Cancer Screening  07/12/2025  . HIV Screening  Completed

## 2020-11-18 NOTE — Progress Notes (Signed)
Nicole Joyce is a 65 y.o. female who presents for welcome to Medicare wellness visit and follow-up on chronic medical conditions.  She has the following concerns:  Bilateral lower abdominal pain and low back.  Reports history of endometriosis. States she has pain with intercourse so she avoids having sex. Has not followed up with OB/GYN  Chronic constipation - has a bowel movement every 2-3 days. Is taking nothing for this.   Asthma- no recent flares.     Immunization History  Administered Date(s) Administered  . PFIZER(Purple Top)SARS-COV-2 Vaccination 12/14/2019, 01/04/2020   Last Pap smear: 2017 and normal with neg HPV Last mammogram: 2018 Last colonoscopy: 2016  Last DEXA: years  Dentist: planning to have teeth pulled and dentures  Ophtho: Dr. Caryn Section Exercise: not walking due to weather  Other doctors caring for patient include: Dr. Alvester Morin- orthopedist   Married. Has a daughter. Teacher at pre-K    Depression screen:  See questionnaire below.  Depression screen PHQ 2/9 11/18/2020  Decreased Interest 0  Down, Depressed, Hopeless 0  PHQ - 2 Score 0    Fall Risk Screen: see questionnaire below. Fall Risk  11/18/2020  Falls in the past year? 1  Number falls in past yr: 0  Injury with Fall? 0    ADL screen:  See questionnaire below Functional Status Survey: Is the patient deaf or have difficulty hearing?: Yes (sometimes) Does the patient have difficulty seeing, even when wearing glasses/contacts?: No Does the patient have difficulty concentrating, remembering, or making decisions?: No Does the patient have difficulty walking or climbing stairs?: Yes (aches and pains when climbing) Does the patient have difficulty dressing or bathing?: No Does the patient have difficulty doing errands alone such as visiting a doctor's office or shopping?: No   End of Life Discussion:  Patient does not have a living will and medical power of attorney. Paperwork given and discussed. MOST  form filled out.   Review of Systems Constitutional: -fever, -chills, -sweats, -unexpected weight change, -anorexia, -fatigue Allergy: -sneezing, -itching, -congestion Dermatology: denies changing moles, rash, lumps, new worrisome lesions ENT: -runny nose, -ear pain, -sore throat, -hoarseness, -sinus pain, -teeth pain, -tinnitus, -hearing loss, -epistaxis Cardiology:  -chest pain, -palpitations, -edema, -orthopnea, -paroxysmal nocturnal dyspnea Respiratory: -cough, -shortness of breath, -dyspnea on exertion, -wheezing, -hemoptysis Gastroenterology: +abdominal pain, -nausea, -vomiting, -diarrhea, -constipation, -blood in stool, -changes in bowel movement, -dysphagia Hematology: -bleeding or bruising problems Musculoskeletal: -arthralgias, -myalgias, -joint swelling, -back pain, -neck pain, -cramping, -gait changes Ophthalmology: -vision changes, -eye redness, -itching, -discharge Urology: -dysuria, -difficulty urinating, -hematuria, -urinary frequency, -urgency, incontinence Neurology: -headache, -weakness, -tingling, -numbness, -speech abnormality, -memory loss, -falls, -dizziness Psychology:  -depressed mood, -agitation, -sleep problems    PHYSICAL EXAM:  BP 120/70   Pulse 80   Ht 5' 4.5" (1.638 m)   Wt 157 lb (71.2 kg)   BMI 26.53 kg/m   General Appearance: Alert, cooperative, no distress, appears stated age Head: Normocephalic, without obvious abnormality, atraumatic Eyes: PERRL, conjunctiva/corneas clear, EOM's intact Ears: Normal TM's and external ear canals Nose: mask on  Throat: mask on  Neck: Supple, no lymphadenopathy; thyroid: no enlargement/tenderness/nodules; no JVD Back: Spine nontender, no curvature, ROM normal, no CVA tenderness Lungs: Clear to auscultation bilaterally without wheezes, rales or ronchi; respirations unlabored Chest Wall: No tenderness or deformity Heart: Regular rate and rhythm, S1 and S2 normal, no murmur, rub or gallop Breast Exam: No  tenderness, masses, or nipple discharge or inversion. No axillary lymphadenopathy Abdomen: Soft, nondistended, normoactive bowel sounds, bilateral  lower abdominal TTP without rebound,  no masses, no hepatosplenomegaly Genitalia: Normal external genitalia without lesions.  BUS and vagina normal; cervix without lesions, or cervical motion tenderness. No abnormal vaginal discharge.  Uterus and adnexa not enlarged, nontender, no masses.  Pap performed. chaperone present  Extremities: No clubbing, cyanosis or edema Pulses: 2+ and symmetric all extremities Skin: Skin color, texture, turgor normal, no rashes or lesions Lymph nodes: Cervical, supraclavicular, and axillary nodes normal Neurologic: CNII-XII intact, normal strength, sensation and gait; reflexes 2+ and symmetric throughout Psych: Normal mood, affect, hygiene and grooming.  ASSESSMENT/PLAN: Welcome to Medicare preventive visit - Plan: CBC with Differential/Platelet, Comprehensive metabolic panel, TSH, T4, free, T3, EKG 12-Lead, Lipid panel -This is her first Medicare wellness visit.  Preventive health care reviewed and updated.  Counseling on healthy lifestyle including diet and exercise.  I recommend regular dental and eye exams.  In-depth discussion regarding advanced directives.  Immunizations reviewed.  Denies any issues with falls, depression, ADLs, memory.  Estrogen deficiency - Plan: DG Bone Density -She will call and schedule her bone density  Encounter for breast cancer screening using non-mammogram modality - Plan: MM DIGITAL SCREENING BILATERAL -She will call and schedule her mammogram.    Smoker - Plan: DG Chest 2 View -Encouraged smoking cessation  Mild intermittent asthma without complication -Controlled  Chronic constipation - Plan: TSH, T4, free, T3 -She may try probiotic for 4 weeks.  She may also try stool softeners.  Encouraged good fiber in her diet and hydration  Need for hepatitis C screening test - Plan:  Hepatitis C antibody -Done per screening guidelines  Screening for HIV without presence of risk factors - Plan: HIV Antibody (routine testing w rflx) -Done per screening guidelines  History of endometriosis - Plan: US Pelvic Complete With Transvaginal  Dyspareunia in female - Plan: Cytology - PAP([place]), US Pelvic Complete With Transvaginal -Follow-up pending results.  Most likely will need referral to gynecology  Abnormal lung sounds - Plan: DG Chest 2 View -Asymptomatic.  I will send her for chest x-ray due to long history of smoking  Screening for lipid disorders - Plan: Lipid panel -Encourage low-fat diet and increasing physical activity.  Follow-up pending lipid panel results  Screening for cervical cancer - Plan: Cytology - PAP([place]) -Done per guidelines  Pelvic pain - Plan: US Pelvic Complete With Transvaginal -History of endometriosis.  We will get an ultrasound and then refer to OB/GYN  Lower abdominal tenderness - Plan: US Pelvic Complete With Transvaginal -Ultrasound ordered.  Referral to OB/GYN will most likely be needed.     Discussed monthly self breast exams and yearly mammograms; at least 30 minutes of aerobic activity at least 5 days/week and weight-bearing exercise 2x/week; proper sunscreen use reviewed; healthy diet, including goals of calcium and vitamin D intake and alcohol recommendations (less than or equal to 1 drink/day) reviewed; regular seatbelt use; changing batteries in smoke detectors.  Immunization recommendations discussed.  Colonoscopy recommendations reviewed   Medicare Attestation I have personally reviewed: The patient's medical and social history Their use of alcohol, tobacco or illicit drugs Their current medications and supplements The patient's functional ability including ADLs,fall risks, home safety risks, cognitive, and hearing and visual impairment Diet and physical activities Evidence for depression or mood  disorders  The patient's weight, height, and BMI have been recorded in the chart.  I have made referrals, counseling, and provided education to the patient based on review of the above and I have provided the patient  with a written personalized care plan for preventive services.     Hetty Blend, NP-C   11/19/2020

## 2020-11-19 LAB — LIPID PANEL
Chol/HDL Ratio: 2.3 ratio (ref 0.0–4.4)
Cholesterol, Total: 214 mg/dL — ABNORMAL HIGH (ref 100–199)
HDL: 94 mg/dL (ref >39)
LDL Chol Calc (NIH): 110 mg/dL — ABNORMAL HIGH (ref 0–99)
Triglycerides: 57 mg/dL (ref 0–149)
VLDL Cholesterol Cal: 10 mg/dL (ref 5–40)

## 2020-11-19 LAB — CBC WITH DIFFERENTIAL/PLATELET
Basophils Absolute: 0 10*3/uL (ref 0.0–0.2)
Basos: 1 %
EOS (ABSOLUTE): 0 10*3/uL (ref 0.0–0.4)
Immature Grans (Abs): 0 10*3/uL (ref 0.0–0.1)
Immature Granulocytes: 0 %
Lymphocytes Absolute: 3.5 10*3/uL — ABNORMAL HIGH (ref 0.7–3.1)
Lymphs: 52 %
MCH: 29.5 pg (ref 26.6–33.0)
MCV: 88 fL (ref 79–97)
Monocytes Absolute: 0.4 10*3/uL (ref 0.1–0.9)
Monocytes: 6 %
Neutrophils: 41 %
Platelets: 339 10*3/uL (ref 150–450)
RBC: 4.41 x10E6/uL (ref 3.77–5.28)
RDW: 14.8 % (ref 11.7–15.4)
WBC: 6.6 10*3/uL (ref 3.4–10.8)

## 2020-11-19 LAB — COMPREHENSIVE METABOLIC PANEL
ALT: 18 IU/L (ref 0–32)
Albumin: 4.9 g/dL — ABNORMAL HIGH (ref 3.8–4.8)
Alkaline Phosphatase: 91 IU/L (ref 44–121)
BUN/Creatinine Ratio: 13 (ref 12–28)
Bilirubin Total: 1 mg/dL (ref 0.0–1.2)
CO2: 25 mmol/L (ref 20–29)
Calcium: 10.4 mg/dL — ABNORMAL HIGH (ref 8.7–10.3)
Chloride: 101 mmol/L (ref 96–106)
Creatinine, Ser: 0.92 mg/dL (ref 0.57–1.00)
GFR calc Af Amer: 76 mL/min/{1.73_m2} (ref >59)
GFR calc non Af Amer: 66 mL/min/{1.73_m2} (ref >59)
Glucose: 90 mg/dL (ref 65–99)
Potassium: 4.4 mmol/L (ref 3.5–5.2)
Sodium: 139 mmol/L (ref 134–144)

## 2020-11-19 LAB — HEPATITIS C ANTIBODY: Hep C Virus Ab: 0.1 s/co ratio (ref 0.0–0.9)

## 2020-11-19 LAB — TSH: TSH: 0.894 u[IU]/mL (ref 0.450–4.500)

## 2020-11-19 LAB — HIV ANTIBODY (ROUTINE TESTING W REFLEX): HIV Screen 4th Generation wRfx: NONREACTIVE

## 2020-11-19 LAB — T3: T3, Total: 145 ng/dL (ref 71–180)

## 2020-11-20 LAB — CYTOLOGY - PAP: Diagnosis: NEGATIVE

## 2020-11-23 ENCOUNTER — Other Ambulatory Visit: Payer: Self-pay | Admitting: Family Medicine

## 2020-11-23 DIAGNOSIS — Z9189 Other specified personal risk factors, not elsewhere classified: Secondary | ICD-10-CM

## 2020-11-23 DIAGNOSIS — E7800 Pure hypercholesterolemia, unspecified: Secondary | ICD-10-CM

## 2020-11-23 MED ORDER — ATORVASTATIN CALCIUM 10 MG PO TABS: 10.0000 mg | ORAL_TABLET | Freq: Every day | ORAL | 1 refills | Status: DC

## 2020-11-23 NOTE — Progress Notes (Signed)
Her labs overall are good and her pap smear is negative which is good. Her LDL or bad cholesterol is higher than we would like. This along with her other risk factors for heart disease suggest she should be on a medication to help lower cholesterol such as Lipitor or Crestor. Has she ever taken a statin for her cholesterol? Is she willing to start on one?

## 2020-11-30 ENCOUNTER — Ambulatory Visit
Admission: RE | Admit: 2020-11-30 | Discharge: 2020-11-30 | Disposition: A | Discharge status: Home / Self Care | Payer: Medicare HMO | Source: Ambulatory Visit | Attending: Family Medicine | Admitting: Family Medicine

## 2020-11-30 DIAGNOSIS — Z8742 Personal history of other diseases of the female genital tract: Secondary | ICD-10-CM

## 2020-11-30 DIAGNOSIS — R102 Pelvic and perineal pain: Secondary | ICD-10-CM

## 2020-11-30 DIAGNOSIS — R10819 Abdominal tenderness, unspecified site: Secondary | ICD-10-CM

## 2020-11-30 DIAGNOSIS — N941 Unspecified dyspareunia: Secondary | ICD-10-CM

## 2020-12-01 NOTE — Progress Notes (Signed)
Her pelvic US was negative, nothing to explain her symptoms. Does she want a referral to a gynecologist?

## 2020-12-02 ENCOUNTER — Other Ambulatory Visit: Payer: Self-pay | Admitting: Internal Medicine

## 2020-12-02 DIAGNOSIS — Z8742 Personal history of other diseases of the female genital tract: Secondary | ICD-10-CM

## 2020-12-02 DIAGNOSIS — N941 Unspecified dyspareunia: Secondary | ICD-10-CM

## 2021-01-05 ENCOUNTER — Other Ambulatory Visit: Payer: Self-pay

## 2021-01-05 ENCOUNTER — Ambulatory Visit: Payer: Medicare Other | Admitting: Obstetrics and Gynecology

## 2021-01-05 ENCOUNTER — Encounter: Payer: Self-pay | Admitting: Obstetrics and Gynecology

## 2021-01-05 VITALS — BP 122/82 | HR 69 | Ht 64.5 in | Wt 156.2 lb

## 2021-01-05 DIAGNOSIS — N952 Postmenopausal atrophic vaginitis: Secondary | ICD-10-CM | POA: Diagnosis not present

## 2021-01-05 DIAGNOSIS — K5900 Constipation, unspecified: Secondary | ICD-10-CM

## 2021-01-05 DIAGNOSIS — R109 Unspecified abdominal pain: Secondary | ICD-10-CM

## 2021-01-05 DIAGNOSIS — N941 Unspecified dyspareunia: Secondary | ICD-10-CM | POA: Diagnosis not present

## 2021-01-05 DIAGNOSIS — M6289 Other specified disorders of muscle: Secondary | ICD-10-CM

## 2021-01-05 DIAGNOSIS — R102 Pelvic and perineal pain: Secondary | ICD-10-CM

## 2021-01-05 DIAGNOSIS — E785 Hyperlipidemia, unspecified: Secondary | ICD-10-CM | POA: Insufficient documentation

## 2021-01-05 MED ORDER — ESTRADIOL 10 MCG VA TABS: ORAL_TABLET | VAGINAL | 0 refills | Status: DC

## 2021-01-05 NOTE — Patient Instructions (Addendum)
Atrophic Vaginitis  Atrophic vaginitis is a condition in which the tissues that line the vagina become dry and thin. This condition is most common in women who have stopped having regular menstrual periods (are in menopause). This usually starts when a woman is 45 to 66 years old. That is the time when a woman's estrogen levels begin to decrease. Estrogen is a female hormone. It helps to keep the tissues of the vagina moist. It stimulates the vagina to produce a clear fluid that lubricates the vagina for sex. This fluid also protects the vagina from infection. Lack of estrogen can cause the lining of the vagina to get thinner and dryer. The vagina may also shrink in size. It may become less elastic. Atrophic vaginitis tends to get worse over time as a woman's estrogen level drops. What are the causes? This condition is caused by the normal drop in estrogen that happens around the time of menopause. What increases the risk? Certain conditions or situations may lower a woman's estrogen level, leading to a higher risk for atrophic vaginitis. You are more likely to develop this condition if:  You are taking medicines that block estrogen.  You have had your ovaries removed.  You are being treated for cancer with radiation or medicines (chemotherapy).  You have given birth or are breastfeeding.  You are older than age 50.  You smoke. What are the signs or symptoms? Symptoms of this condition include:  Pain, soreness, a feeling of pressure, or bleeding during sex (dyspareunia).  Vaginal burning, irritation, or itching.  Pain or bleeding when a speculum is used in a vaginal exam.  Having burning pain while urinating.  Vaginal discharge. In some cases, there are no symptoms. How is this diagnosed? This condition is diagnosed based on your medical history and a physical exam. This will include a pelvic exam that checks the vaginal tissues. Though rare, you may also have other tests,  including:  A urine test.  A test that checks the acid balance in your vagina (acid balance test). How is this treated? Treatment for this condition depends on how severe your symptoms are. Treatment may include:  Using an over-the-counter vaginal lubricant before sex.  Using a long-acting vaginal moisturizer.  Using low-dose estrogen for moderate to severe symptoms that do not respond to other treatments. Options include creams, tablets, and inserts (vaginal rings). Before you use a vaginal estrogen, tell your health care provider if you have a history of: ? Breast cancer. ? Endometrial cancer. ? Blood clots. If you are not sexually active and your symptoms are very mild, you may not need treatment. Follow these instructions at home: Medicines  Take over-the-counter and prescription medicines only as told by your health care provider.  Do not use herbal or alternative medicines unless your health care provider says that you can.  Use over-the-counter creams, lubricants, or moisturizers for dryness only as told by your health care provider. General instructions  If your atrophic vaginitis is caused by menopause, discuss all of your menopause symptoms and treatment options with your health care provider.  Do not douche.  Do not use products that can make your vagina dry. These include: ? Scented feminine sprays. ? Scented tampons. ? Scented soaps.  Vaginal sex can help to improve blood flow and elasticity of vaginal tissue. If you choose to have sex and it hurts, try using a water-soluble lubricant or moisturizer right before having sex. Contact a health care provider if:  Your discharge looks   different than normal.  Your vagina has an unusual smell.  You have new symptoms.  Your symptoms do not improve with treatment.  Your symptoms get worse. Summary  Atrophic vaginitis is a condition in which the tissues that line the vagina become dry and thin. It is most common  in women who have stopped having regular menstrual periods (are in menopause).  Treatment options include using vaginal lubricants and low-dose vaginal estrogen.  Contact a health care provider if your vagina has an unusual smell, or if your symptoms get worse or do not improve after treatment. This information is not intended to replace advice given to you by your health care provider. Make sure you discuss any questions you have with your health care provider. Document Revised: 04/02/2020 Document Reviewed: 04/02/2020 Elsevier Patient Education  2021 Elsevier Inc.  Dyspareunia, Female Dyspareunia is pain that is associated with sexual activity. This can affect any part of the genitals or lower abdomen. There are many possible causes of this condition. In some cases, diagnosing the cause of dyspareunia can be difficult. This condition can be mild, moderate, or severe. Depending on the cause, dyspareunia may get better with treatment, but may return (recur) over time. What are the causes? The cause of this condition is not always known. However, problems that affect the vulva, vagina, uterus, and other organs may cause dyspareunia. Common causes of this condition include:  Severe pain and tenderness of the vulva when it is touched (vulvodynia).  Vaginal dryness.  Giving birth.  Infection.  Skin changes or conditions.  Side effects of medicines.  Endometriosis. This is when tissue that is like the lining of the uterus grows on the outside of the uterus.  Psychological conditions. These include depression, anxiety, or traumatic experiences.  Allergic reaction.   What increases the risk? The following factors may make you more likely to develop this condition:  History of physical or sexual trauma.  Some medicines.  No longer having a monthly period (menopause).  Having recently given birth.  Taking baths using soaps that have perfumes. These can cause  irritation.  Douching. What are the signs or symptoms? The main symptom of this condition is pain in any part of your genitals or lower abdomen during or after sex. This may include:  Irritation, burning, or stinging sensations in your vulva.  Discomfort when your vulva or surrounding area is touched.  Aching and throbbing pain that may be constant.  Pain that gets worse when something is inserted into your vagina. How is this diagnosed? This condition may be diagnosed based on:  Your symptoms, including where and when your pain occurs.  Your medical history.  A physical exam. A pelvic exam will most likely be done.  Tests that include ultrasound, blood tests, and tests that check the body for infection.  Imaging tests, such as X-ray, MRI, and CT scan. You may be referred to a health care provider who specializes in women's health (gynecologist). How is this treated? Treatment depends on the cause of your condition and your symptoms. In most cases, you may need to stop sexual activity until your symptoms go away or get better. Treatment may include:  Lubricants, ointments, and creams.  Physical therapy.  Massage therapy.  Hormonal therapy.  Medicines to: ? Prevent or fight infection. ? Relieve pain. ? Help numb the area. ? Treat depression (antidepressants).  Counseling, which may include sex therapy.  Surgery. Follow these instructions at home: Lifestyle  Wear cotton underwear.  Use  water-based lubricants as needed during sex. Avoid oil-based lubricants.  Do not use any products that can cause irritation. This may include certain condoms, spermicides, lubricants, soaps, tampons, vaginal sprays, or douches.  Always practice safe sex. Use a condom to prevent sexually transmitted infections (STIs).  Talk freely with your partner about your condition. General instructions  Take or apply over-the-counter and prescription medicines only as told by your health  care provider.  Urinate before you have sex.  Consider joining a support group.  Get the results of any tests you have done. Ask your health care provider, or the department that is doing the procedure, when your results will be ready.  Keep all follow-up visits as told by your health care provider. This is important. Contact a health care provider if:  You have vaginal bleeding after having sex.  You develop a lump at the opening of your vagina even if the lump is painless.  You have: ? Abnormal discharge from your vagina. ? Vaginal dryness. ? Itchiness or irritation of your vulva or vagina. ? A new rash. ? Symptoms that get worse or do not improve with treatment. ? A fever. ? Pain when you urinate. ? Blood in your urine. Get help right away if:  You have severe pain in your abdomen during or shortly after sex.  You pass out after sex. Summary  Dyspareunia is pain that is associated with sexual activity. This can affect any part of the genitals or lower abdomen.  There are many causes of this condition. Treatment depends on the cause and your symptoms. In most cases, you may need to stop sexual activity until your symptoms improve.  Take or apply over-the-counter and prescription medicines only as told by your health care provider.  Contact a health care provider if your symptoms get worse or do not improve with treatment.  Keep all follow-up visits as told by your health care provider. This is important. This information is not intended to replace advice given to you by your health care provider. Make sure you discuss any questions you have with your health care provider. Document Revised: 11/14/2019 Document Reviewed: 12/10/2018 Elsevier Patient Education  2021 [employer].

## 2021-01-05 NOTE — Progress Notes (Signed)
65 y.o. [id number] Married Black or [place] American Not Hispanic or Latino female here for pelvic pressure. The patient states that she had an ultrasound 11/30/20    She c/o lower abdominal pressure/discomfort. It has been occurring intermittently for the last 10 years. She has some spinal issues and isn't sure if that is the cause. She does c/o bloating. No vaginal pressure or bulging.  She isn't sexually active because of dyspareunia. She has entry and deep dyspareunia. She tried a lubricant, helped a little. She did have some spotting, feels like she tore. No other vaginal bleeding  She has a BM every 2-3 days, she has to strain some. This isn't a change. No urinary c/o. May have occasional urge incontinence with a full bladder, small amount.   One daughter, born when she was 58. H/O infertility after that. H/O endometriosis.   Pelvic ultrasound images and report from 11/30/20 were reviewed.  Normal pelvic ultrasound with endometrial stripe of 2 mm (uniform)  No LMP recorded. Patient is postmenopausal.          Sexually active: No.  The current method of family planning is post menopausal status.    Exercising: Yes.    Walking  Smoker:  yes  Health Maintenance: Pap:  11/18/20 WNL  History of abnormal Pap:  no MMG:  2018 Bi-rads 2 benign  BMD:   None  Colonoscopy: 2016 F/u 10 years  TDaP:  Up to date Gardasil: NA   reports that she has been smoking cigarettes. She has a 25.00 pack-year smoking history. She has never used smokeless tobacco. She reports current alcohol use. She reports current drug use. Drug: Marijuana. Just occasional ETOH. One daughter, 3 grandchildren, 7 great grand-children. Family is local.  She is a Emergency planning/management officer.   Past Medical History:  Diagnosis Date  . Anemia   . Asthma   . Dysmenorrhea   . Elevated lipids   . Endometriosis   . Hormone disorder   . Infertility, female   . PID (pelvic inflammatory disease)     Past Surgical History:  Procedure  Laterality Date  . ABDOMINAL SURGERY    . PELVIC LAPAROSCOPY    . ROTATOR CUFF REPAIR    Laparotomy with treatment of endometriosis  Current Outpatient Medications  Medication Sig Dispense Refill  . atorvastatin (LIPITOR) 10 MG tablet Take 1 tablet (10 mg total) by mouth daily. 60 tablet 1   No current facility-administered medications for this visit.    Family History  Problem Relation Age of Onset  . COPD Mother   . Throat cancer Father   . Hypertension Sister   . COPD Sister   . Hypertension Brother   . COPD Brother   . Lung cancer Paternal Grandfather   . Hypertension Sister   . Hypertension Sister     Review of Systems  Exam:   BP 122/82   Pulse 69   Ht 5' 4.5" (1.638 m)   Wt 156 lb 3.2 oz (70.9 kg)   SpO2 99%   BMI 26.40 kg/m   Weight change: @WEIGHTCHANGE @ Height:   Height: 5' 4.5" (163.8 cm)  Ht Readings from Last 3 Encounters:  01/05/21 5' 4.5" (1.638 m)  11/18/20 5' 4.5" (1.638 m)  09/24/20 5\' 5"  (1.651 m)    General appearance: alert, cooperative and appears stated age Head: Normocephalic, without obvious abnormality, atraumatic Neck: no adenopathy, supple, symmetrical, trachea midline and thyroid normal to inspection and palpation Abdomen: soft, minimally tender BLQ; non distended,  no  masses,  no organomegaly. Not tender with palpation on a tensed abdomen Extremities: extremities normal, atraumatic, no cyanosis or edema Skin: Skin color, texture, turgor normal. No rashes or lesions Lymph nodes: Cervical, supraclavicular nodes normal. No abnormal inguinal nodes palpated Neurologic: Grossly normal   Pelvic: External genitalia:  no lesions              Urethra:  normal appearing urethra with no masses, tenderness or lesions              Bartholins and Skenes: normal                 Vagina: mildly atrophic appearing vagina with normal color and discharge, no lesions. No prolapse              Cervix: no cervical motion tenderness and no lesions                Bimanual Exam:  Uterus:  normal size, contour, position, consistency, mobility, non-tender and anteverted              Adnexa: no masses, +/- tender, more tender with abdominal palpation               Rectovaginal: Confirms               Anus:  normal sphincter tone, no lesions  Pelvic floor: moderately tender on the right.   Carolynn Serve chaperoned for the exam.   1. Combined abdominal and pelvic pain She does have some pelvic floor tenderness, this could contribute to her pain. Her pelvic ultrasound is normal. She could have scar tissue from prior surgeries, but I suspect constipation is contributing to her pain as well  2. Pelvic floor dysfunction Likely contributing to dyspareunia and abdominal/pelvic pain - Ambulatory referral to Physical Therapy  3. Dyspareunia in female Entry and deep - Ambulatory referral to Physical Therapy -Vaginal estrogen, use lubrication  4. Vaginal atrophy - Estradiol 10 MCG TABS vaginal tablet; Place one tablet vaginally qhs x 1 week, then change to 2 x a week.  Dispense: 24 tablet; Refill: 0 -f/u in 2 months

## 2021-01-06 ENCOUNTER — Ambulatory Visit: Payer: Medicare Other

## 2021-01-06 ENCOUNTER — Telehealth: Payer: Self-pay | Admitting: *Deleted

## 2021-01-06 NOTE — Telephone Encounter (Signed)
-----   Message from Romualdo Bolk, MD sent at 01/05/2021  3:07 PM EDT ----- I've placed a referral for pelvic floor PT at [facility].  Thanks, Noreene Larsson

## 2021-01-06 NOTE — Telephone Encounter (Signed)
Office notes faxed to [facility] [phone], left detailed message on home # referral has been faxed and they will call her to schedule. If she doesn't hear anything from them she may call [phone]

## 2021-01-11 ENCOUNTER — Other Ambulatory Visit: Payer: Medicare Other

## 2021-01-12 ENCOUNTER — Other Ambulatory Visit: Payer: Medicare Other

## 2021-01-12 ENCOUNTER — Other Ambulatory Visit: Payer: Self-pay

## 2021-01-12 DIAGNOSIS — Z9189 Other specified personal risk factors, not elsewhere classified: Secondary | ICD-10-CM

## 2021-01-12 DIAGNOSIS — E7800 Pure hypercholesterolemia, unspecified: Secondary | ICD-10-CM

## 2021-01-13 LAB — LIPID PANEL
Chol/HDL Ratio: 2 ratio (ref 0.0–4.4)
Cholesterol, Total: 162 mg/dL (ref 100–199)
HDL: 81 mg/dL (ref >39)
LDL Chol Calc (NIH): 69 mg/dL (ref 0–99)
Triglycerides: 61 mg/dL (ref 0–149)
VLDL Cholesterol Cal: 12 mg/dL (ref 5–40)

## 2021-01-18 ENCOUNTER — Other Ambulatory Visit: Payer: Self-pay | Admitting: Family Medicine

## 2021-01-18 DIAGNOSIS — E7800 Pure hypercholesterolemia, unspecified: Secondary | ICD-10-CM

## 2021-01-18 DIAGNOSIS — Z9189 Other specified personal risk factors, not elsewhere classified: Secondary | ICD-10-CM

## 2021-01-20 ENCOUNTER — Other Ambulatory Visit: Payer: Self-pay | Admitting: Obstetrics and Gynecology

## 2021-01-20 DIAGNOSIS — N952 Postmenopausal atrophic vaginitis: Secondary | ICD-10-CM

## 2021-01-20 NOTE — Telephone Encounter (Signed)
  Per office note on 01/05/21 Dr.Jertson only prescribed 24 tablet with 0 refills.

## 2021-02-09 NOTE — Telephone Encounter (Signed)
Urology called and left message on 01/13/21 and sent letter. Encounter will be closed.

## 2021-02-26 ENCOUNTER — Ambulatory Visit
Admission: RE | Admit: 2021-02-26 | Discharge: 2021-02-26 | Disposition: A | Discharge status: Home / Self Care | Payer: Medicare Other | Source: Ambulatory Visit | Attending: Family Medicine | Admitting: Family Medicine

## 2021-02-26 ENCOUNTER — Other Ambulatory Visit: Payer: Self-pay

## 2021-02-26 DIAGNOSIS — Z1239 Encounter for other screening for malignant neoplasm of breast: Secondary | ICD-10-CM

## 2021-02-26 DIAGNOSIS — Z1231 Encounter for screening mammogram for malignant neoplasm of breast: Secondary | ICD-10-CM | POA: Diagnosis not present

## 2021-03-10 ENCOUNTER — Ambulatory Visit: Payer: Medicare Other | Admitting: Obstetrics and Gynecology

## 2021-03-18 ENCOUNTER — Ambulatory Visit: Payer: Medicare Other | Admitting: Obstetrics and Gynecology

## 2021-04-05 ENCOUNTER — Ambulatory Visit: Payer: Medicare Other | Admitting: Obstetrics and Gynecology

## 2021-04-05 DIAGNOSIS — Z0289 Encounter for other administrative examinations: Secondary | ICD-10-CM

## 2021-04-15 ENCOUNTER — Other Ambulatory Visit: Payer: Medicare Other

## 2021-05-13 ENCOUNTER — Other Ambulatory Visit: Payer: Self-pay | Admitting: Family Medicine

## 2021-07-30 ENCOUNTER — Other Ambulatory Visit: Payer: Self-pay | Admitting: Family Medicine

## 2021-07-30 DIAGNOSIS — E7800 Pure hypercholesterolemia, unspecified: Secondary | ICD-10-CM

## 2021-07-30 DIAGNOSIS — Z9189 Other specified personal risk factors, not elsewhere classified: Secondary | ICD-10-CM

## 2021-08-05 ENCOUNTER — Encounter: Payer: Self-pay | Admitting: Family Medicine

## 2021-08-05 ENCOUNTER — Ambulatory Visit (INDEPENDENT_AMBULATORY_CARE_PROVIDER_SITE_OTHER): Payer: Medicare Other | Admitting: Family Medicine

## 2021-08-05 ENCOUNTER — Other Ambulatory Visit: Payer: Self-pay

## 2021-08-05 VITALS — Temp 97.6°F | Ht 64.5 in | Wt 157.0 lb

## 2021-08-05 DIAGNOSIS — J4520 Mild intermittent asthma, uncomplicated: Secondary | ICD-10-CM | POA: Diagnosis not present

## 2021-08-05 DIAGNOSIS — J302 Other seasonal allergic rhinitis: Secondary | ICD-10-CM | POA: Diagnosis not present

## 2021-08-05 MED ORDER — ALBUTEROL SULFATE HFA 108 (90 BASE) MCG/ACT IN AERS: 2.0000 | INHALATION_SPRAY | Freq: Four times a day (QID) | RESPIRATORY_TRACT | 0 refills | Status: DC | PRN

## 2021-08-05 NOTE — Patient Instructions (Signed)
Please be sure to drink plenty of water. If you get postnasal drainage and tickle/phlegm in your throat, you can try gargles, lozenges, and guaifenesin (in Robitussin or mucinex). If you have any wheezing, chest tightness or shortness of breath, use the inhaler.  If needing the inhaler very frequently, please seek re-evaluation.  It does sound like this could be a flare of allergies.  Please take a once-daily antihistamine such as allegra, or claritin or zyrtec--these medications last all day, rather than having to remember to take something every 4- hours.  Continue to not smoke--that will help your asthma and breathing more than anything else, and will help prevent bacterial infections.  Your home COVID test was negative.  Your symptoms aren't consistent wit flu. Since you are feeling better, you may return to work tomorrow.

## 2021-08-05 NOTE — Progress Notes (Signed)
Start time: 3:02 End time: 3:20  Virtual Visit via Video Note  I connected with Nicole Joyce on 08/05/21 by a video enabled telemedicine application and verified that I am speaking with the correct person using two identifiers.  Location: Patient: in car Provider: office   I discussed the limitations of evaluation and management by telemedicine and the availability of in person appointments. The patient expressed understanding and agreed to proceed.  History of Present Illness:  Chief Complaint  Patient presents with   Headache    VIRTUAL had a headache in the beginning of the week and was sneezing, coughing and had watery eyes. Felt weak and fatigued. Didn't feel like going to work all week. Feeling much better now. She did a home covid test today that was negative. Wants to go back to work Monday. Also asking for a new rx for albuterol inhaler.    "I'm fine now"  Monday and Tuesday of this week she had bitemporal headache, like someone was squeezing her head.  She had been working outside when very windy.  While outside she was sneezing and coughing, and later developed itchy ears and watery eyes. Nose was congested, blew her nose, and saw blood. She had decreased appetite, but no nausea, vomiting, or diarrhea. Currently denies runny nose, PND, sore throat or cough. Denies plugging, popping or pain in the ears.  She took an OTC allergy med which did help (from [employer], directions to take every 4 hours).  She has h/o asthma.  She only needs to use inhaler is around the holidays [employer], Thanksgiving and Christmas).  She doesn't currently feel tight or wheezy (she would have used it Monday if she had an inhaler then). Asking for refill to have on hand.  She felt better yesterday, and was able to eat. Was taking the allergy medicine (started Tuesday morning). She had a negative COVID test at home today.  She is a smoker. Reports that her quit date was Monday, only smoked  2 cigarettes since then, none today. Daughter quit 30 days ago, which is motivating.   PMH, PSH, SH reviewed  Outpatient Encounter Medications as of 08/05/2021  Medication Sig   atorvastatin (LIPITOR) 10 MG tablet TAKE 1 TABLET BY MOUTH EVERY DAY   gabapentin (NEURONTIN) 100 MG capsule TAKE 1 CAPSULE AT BEDTIME MAY INCREASE TO 1 CAPSULE 3 TIMES A DAY IF NEEDED (Patient not taking: Reported on 08/05/2021)   [DISCONTINUED] Estradiol 10 MCG TABS vaginal tablet Place one tablet vaginally qhs x 1 week, then change to 2 x a week. (Patient not taking: Reported on 08/05/2021)   No facility-administered encounter medications on file as of 08/05/2021.   No Known Allergies  ROS:  Never had fever or chills, n/v/d, bleeding, bruising/rashes. No chest pain. Currently without any shortness of breath. No myalgias +itchy eyes/ears per HPI.      Observations/Objective:  Temp 97.6 F (36.4 C) (Temporal)   Ht 5' 4.5" (1.638 m)   Wt 157 lb (71.2 kg)   BMI 26.53 kg/m   Well-appearing, pleasant female in no distress Mainly wearing mask during visit (at first wasn't, noted many teeth missing) She is alert, oriented, in good spirits and talking easily. No coughing, throat-clearing or sniffling during visit. Exam is limited due to virtual nature of visit. Patient reports having negative home COVID test today.   Assessment and Plan:  Seasonal allergies - encouraged once-daily antihistamine such as claritin or allegra or zyrtec  Mild intermittent asthma, unspecified whether complicated -  refilled inhaler to have on hand to use prn. To f/u if requiring frequently - Plan: albuterol (VENTOLIN HFA) 108 (90 Base) MCG/ACT inhaler  Please be sure to drink plenty of water. If you get postnasal drainage and tickle/phlegm in your throat, you can try gargles, lozenges, and guaifenesin (in Robitussin or mucinex). If you have any wheezing, chest tightness or shortness of breath, use the inhaler.  If needing  the inhaler very frequently, please seek re-evaluation.  It does sound like this could be a flare of allergies.  Please take a once-daily antihistamine such as allegra, or claritin or zyrtec--these medications last all day, rather than having to remember to take something every 4- hours.  Continue to not smoke--that will help your asthma and breathing more than anything else, and will help prevent bacterial infections.  Your home COVID test was negative.  Your symptoms aren't consistent wit flu. Since you are feeling better, you may return to work tomorrow.     Follow Up Instructions:    I discussed the assessment and treatment plan with the patient. The patient was provided an opportunity to ask questions and all were answered. The patient agreed with the plan and demonstrated an understanding of the instructions.   The patient was advised to call back or seek an in-person evaluation if the symptoms worsen or if the condition fails to improve as anticipated.  I spent 21 minutes dedicated to the care of this patient, including pre-visit review of records, face to face time, post-visit ordering of testing and documentation.    Nicole Jumbo, MD

## 2021-08-11 ENCOUNTER — Encounter (HOSPITAL_COMMUNITY): Payer: Self-pay

## 2021-08-11 ENCOUNTER — Ambulatory Visit (INDEPENDENT_AMBULATORY_CARE_PROVIDER_SITE_OTHER): Payer: Medicare Other

## 2021-08-11 ENCOUNTER — Ambulatory Visit (HOSPITAL_COMMUNITY)
Admission: EM | Admit: 2021-08-11 | Discharge: 2021-08-11 | Disposition: A | Discharge status: Home / Self Care | Payer: Medicare Other | Attending: Internal Medicine | Admitting: Internal Medicine

## 2021-08-11 ENCOUNTER — Other Ambulatory Visit: Payer: Self-pay

## 2021-08-11 DIAGNOSIS — S61215A Laceration without foreign body of left ring finger without damage to nail, initial encounter: Secondary | ICD-10-CM | POA: Diagnosis not present

## 2021-08-11 DIAGNOSIS — Z23 Encounter for immunization: Secondary | ICD-10-CM

## 2021-08-11 DIAGNOSIS — W540XXA Bitten by dog, initial encounter: Secondary | ICD-10-CM | POA: Diagnosis not present

## 2021-08-11 DIAGNOSIS — M20012 Mallet finger of left finger(s): Secondary | ICD-10-CM | POA: Diagnosis not present

## 2021-08-11 DIAGNOSIS — S61255A Open bite of left ring finger without damage to nail, initial encounter: Secondary | ICD-10-CM | POA: Diagnosis not present

## 2021-08-11 MED ORDER — AMOXICILLIN-POT CLAVULANATE 875-125 MG PO TABS: 1.0000 | ORAL_TABLET | Freq: Two times a day (BID) | ORAL | 0 refills | Status: DC

## 2021-08-11 MED ORDER — TETANUS-DIPHTH-ACELL PERTUSSIS 5-2.5-18.5 LF-MCG/0.5 IM SUSY
0.5000 mL | PREFILLED_SYRINGE | Freq: Once | INTRAMUSCULAR | Status: AC
  Administered 2021-08-11: 20:00:00 0.5 mL via INTRAMUSCULAR

## 2021-08-11 MED ORDER — TETANUS-DIPHTH-ACELL PERTUSSIS 5-2.5-18.5 LF-MCG/0.5 IM SUSY
PREFILLED_SYRINGE | INTRAMUSCULAR | Status: AC
  Filled 2021-08-11: qty 0.5

## 2021-08-11 NOTE — ED Provider Notes (Signed)
[facility]    CSN: [id number] Arrival date & time: 08/11/21  1732      History   Chief Complaint Chief Complaint  Patient presents with   Finger Injury    HPI Nicole Joyce is a 65 y.o. female. Pt states taking a toy from her dog and tooth punctured lt ring finger then she hit it on a piece of metal causing deformity.  Pt cannot extend finger at DIP.  Bleeding controlled in wound at this time. Has not had a tetanus shot within 5 years.    HPI  Past Medical History:  Diagnosis Date   Anemia    Asthma    Dysmenorrhea    Elevated lipids    Endometriosis    Hormone disorder    Infertility, female    PID (pelvic inflammatory disease)     Patient Active Problem List   Diagnosis Date Noted   Elevated lipids    Estrogen deficiency 11/18/2020   Chronic constipation 11/18/2020   Abnormal lung sounds 11/18/2020   Dyspareunia in female 11/18/2020   History of endometriosis 11/18/2020   Chronic left-sided low back pain with left-sided sciatica 02/03/2020   Smoker 02/03/2020   Mild intermittent asthma without complication 02/03/2020   Bilateral arm pain 02/03/2020   History of shingles 02/03/2020   Fall 01/05/2016    Past Surgical History:  Procedure Laterality Date   ABDOMINAL SURGERY     exploratory laparotomy with treatment of endometriosis   PELVIC LAPAROSCOPY     ROTATOR CUFF REPAIR      OB History     Gravida  1   Para  1   Term  1   Preterm      AB      Living  1      SAB      IAB      Ectopic      Multiple      Live Births  1            Home Medications    Prior to Admission medications   Medication Sig Start Date End Date Taking? Authorizing Provider  amoxicillin-clavulanate (AUGMENTIN) 875-125 MG tablet Take 1 tablet by mouth every 12 (twelve) hours. 08/11/21  Yes Cathlyn Parsons, NP  albuterol (VENTOLIN HFA) 108 (90 Base) MCG/ACT inhaler Inhale 2 puffs into the lungs every 6 (six) hours as needed for wheezing or  shortness of breath. 08/05/21   Joselyn Arrow, MD  atorvastatin (LIPITOR) 10 MG tablet TAKE 1 TABLET BY MOUTH EVERY DAY 07/30/21   Ronnald Nian, MD  gabapentin (NEURONTIN) 100 MG capsule TAKE 1 CAPSULE AT BEDTIME MAY INCREASE TO 1 CAPSULE 3 TIMES A DAY IF NEEDED Patient not taking: Reported on 08/05/2021 05/24/21   Hilts, Casimiro Needle, MD    Family History Family History  Problem Relation Age of Onset   COPD Mother    Throat cancer Father    Hypertension Sister    COPD Sister    Hypertension Brother    COPD Brother    Lung cancer Paternal Grandfather    Hypertension Sister    Hypertension Sister     Social History Social History   Tobacco Use   Smoking status: Every Day    Packs/day: 0.50    Years: 50.00    Pack years: 25.00    Types: Cigarettes   Smokeless tobacco: Never   Tobacco comments:    Cut back dramatically this week  Substance Use Topics  Alcohol use: Yes   Drug use: Yes    Types: Marijuana     Allergies   Patient has no known allergies.   Review of Systems Review of Systems   Physical Exam Triage Vital Signs ED Triage Vitals  Enc Vitals Group     BP 08/11/21 1844 135/85     Pulse Rate 08/11/21 1842 76     Resp 08/11/21 1842 18     Temp 08/11/21 1842 99 F (37.2 C)     Temp Source 08/11/21 1842 Oral     SpO2 08/11/21 1842 97 %     Weight --      Height --      Head Circumference --      Peak Flow --      Pain Score 08/11/21 1843 0     Pain Loc --      Pain Edu? --      Excl. in GC? --    No data found.  Updated Vital Signs BP 135/85   Pulse 76   Temp 99 F (37.2 C) (Oral)   Resp 18   SpO2 97%   Visual Acuity Right Eye Distance:   Left Eye Distance:   Bilateral Distance:    Right Eye Near:   Left Eye Near:    Bilateral Near:     Physical Exam Constitutional:      General: She is not in acute distress.    Appearance: Normal appearance.  Pulmonary:     Effort: Pulmonary effort is normal.  Musculoskeletal:       Hands:      Comments: 39mm laceration near DIP on L anterior lateral ring finger. L ring finger in flexion at DIP, pt cannot extend it.  Strength intact at DIP for flexion.   Neurological:     Mental Status: She is alert.     UC Treatments / Results  Labs (all labs ordered are listed, but only abnormal results are displayed) Labs Reviewed - No data to display  EKG   Radiology DG Finger Ring Left  Result Date: 08/11/2021 CLINICAL DATA:  Bitten by her daughter today, open wound, question fracture EXAM: LEFT RING FINGER 2+V COMPARISON:  None FINDINGS: Osseous mineralization decreased. Joint spaces preserved. No acute fracture, dislocation, or bone destruction. Tiny dorsal spur at head of middle phalanx. Mild flexion deformity at the IP joint IMPRESSION: No acute osseous abnormalities. Electronically Signed   By: Ulyses Southward M.D.   On: 08/11/2021 19:23    Procedures Procedures (including critical care time)  Medications Ordered in UC Medications  Tdap (BOOSTRIX) injection 0.5 mL (has no administration in time range)    Initial Impression / Assessment and Plan / UC Course  I have reviewed the triage vital signs and the nursing notes.  Pertinent labs & imaging results that were available during my care of the patient were reviewed by me and considered in my medical decision making (see chart for details).  Spoke with Dr. Merry Proud with hand surgery. Instructed to splint finger in extension at the DIP and PIP and have pt f/u with him.   Wound cleaned, antibiotic ointment applied, bandage applied.   Pt's tetanus updated   Rx augmentin for infection prophylaxis  Final Clinical Impressions(s) / UC Diagnoses   Final diagnoses:  Dog bite, initial encounter  Laceration of left ring finger without foreign body without damage to nail, initial encounter  Mallet deformity of left ring finger     Discharge Instructions  Keep finger wound clean, keep finger splinted in the extended  (straight) position. Call Dr. Thomos Lemons office tomorrow to arrange a follow up appointment. Finish all of the antibiotics as prescribed. Monitor your finger for signs of infection (increased pain, swelling, warmth, redness, pus/drainage) and seek immediate help if finger develops an infection.      ED Prescriptions     Medication Sig Dispense Auth. Provider   amoxicillin-clavulanate (AUGMENTIN) 875-125 MG tablet Take 1 tablet by mouth every 12 (twelve) hours. 14 tablet Cathlyn Parsons, NP      PDMP not reviewed this encounter.   Cathlyn Parsons, NP 08/11/21 1955

## 2021-08-11 NOTE — Discharge Instructions (Addendum)
Keep finger wound clean, keep finger splinted in the extended (straight) position. Call Dr. Thomos Lemons office tomorrow to arrange a follow up appointment. Finish all of the antibiotics as prescribed. Monitor your finger for signs of infection (increased pain, swelling, warmth, redness, pus/drainage) and seek immediate help if finger develops an infection.

## 2021-08-11 NOTE — ED Triage Notes (Signed)
Pt states taking a toy from her dog and tooth punctured lt ring finger then she hit it on a piece of metal causing deformity. Bleeding controlled.

## 2021-08-18 ENCOUNTER — Ambulatory Visit (INDEPENDENT_AMBULATORY_CARE_PROVIDER_SITE_OTHER): Payer: Medicare Other | Admitting: Plastic Surgery

## 2021-08-18 ENCOUNTER — Other Ambulatory Visit: Payer: Self-pay

## 2021-08-18 VITALS — BP 138/84 | HR 70 | Ht 64.5 in | Wt 158.6 lb

## 2021-08-18 DIAGNOSIS — M20019 Mallet finger of unspecified finger(s): Secondary | ICD-10-CM | POA: Diagnosis not present

## 2021-08-18 DIAGNOSIS — S66909A Unspecified injury of unspecified muscle, fascia and tendon at wrist and hand level, unspecified hand, initial encounter: Secondary | ICD-10-CM | POA: Diagnosis not present

## 2021-08-19 NOTE — Progress Notes (Signed)
Referring Provider Avanell Shackleton, PA-C No address on file   CC:  Chief Complaint  Patient presents with   Advice Only      Nicole Joyce is an 65 y.o. female.  HPI: Patient presents after a dog bite injury to the left ring finger.  This occurred about a week ago.  She was seen in the urgent care and noted to have a flexion deformity of the DIP joint of the ring finger.  She was splinted in extension and sent to me for follow-up.  She says she has been compliant with her splint.  No Known Allergies  Outpatient Encounter Medications as of 08/18/2021  Medication Sig   albuterol (VENTOLIN HFA) 108 (90 Base) MCG/ACT inhaler Inhale 2 puffs into the lungs every 6 (six) hours as needed for wheezing or shortness of breath.   amoxicillin-clavulanate (AUGMENTIN) 875-125 MG tablet Take 1 tablet by mouth every 12 (twelve) hours.   atorvastatin (LIPITOR) 10 MG tablet TAKE 1 TABLET BY MOUTH EVERY DAY   gabapentin (NEURONTIN) 100 MG capsule TAKE 1 CAPSULE AT BEDTIME MAY INCREASE TO 1 CAPSULE 3 TIMES A DAY IF NEEDED (Patient not taking: Reported on 08/05/2021)   No facility-administered encounter medications on file as of 08/18/2021.     Past Medical History:  Diagnosis Date   Anemia    Asthma    Dysmenorrhea    Elevated lipids    Endometriosis    Hormone disorder    Infertility, female    PID (pelvic inflammatory disease)     Past Surgical History:  Procedure Laterality Date   ABDOMINAL SURGERY     exploratory laparotomy with treatment of endometriosis   PELVIC LAPAROSCOPY     ROTATOR CUFF REPAIR      Family History  Problem Relation Age of Onset   COPD Mother    Throat cancer Father    Hypertension Sister    COPD Sister    Hypertension Brother    COPD Brother    Lung cancer Paternal Grandfather    Hypertension Sister    Hypertension Sister     Social History   Social History Narrative   Not on file     Review of Systems General: Denies fevers, chills, weight  loss CV: Denies chest pain, shortness of breath, palpitations  Physical Exam Vitals with BMI 08/18/2021 08/11/2021 08/05/2021  Height 5' 4.5" - 5' 4.5"  Weight 158 lbs 10 oz - 157 lbs  BMI 26.81 - 26.54  Systolic 138 135 (No Data)  Diastolic 84 85 (No Data)  Pulse 70 76 -    General:  No acute distress,  Alert and oriented, Non-Toxic, Normal speech and affect Left hand: Fingers well-perfused with normal cap refill to palp radial pulse.  Sensation is intact throughout.  She has full flexion.  Difficulty extending at the DIP joint of the ring finger.  It is held in a slightly flexed posture.  No wounds.  X-ray was reviewed showing no fracture and a flexion deformity at the DIP joint.  Assessment/Plan Patient presents with a mallet injury of the left ring finger.  We discussed operative and nonoperative treatment.  Ultimately I recommended DIP extension splinting.  We applied a volar finger splint today but I sent her to hand therapy for a more appropriate splint that would hopefully allow some range of motion of the PIP joint to avoid stiffness in that area.  I explained she would need to keep this in place for 8 weeks.  She is fully understanding.  I will see her back 4 to 6 weeks from now.  All of her questions were answered.  Nicole Joyce 08/19/2021, 10:55 AM

## 2021-08-23 ENCOUNTER — Other Ambulatory Visit: Payer: Self-pay | Admitting: Family Medicine

## 2021-08-23 DIAGNOSIS — J4520 Mild intermittent asthma, uncomplicated: Secondary | ICD-10-CM

## 2021-08-23 NOTE — Telephone Encounter (Signed)
Just filled 2 weeks ago, called and asked if she truly requested this or just auto-refill. Waiting on call back.

## 2021-08-25 ENCOUNTER — Encounter: Payer: Self-pay | Admitting: Occupational Therapy

## 2021-08-26 ENCOUNTER — Encounter: Payer: Self-pay | Admitting: Occupational Therapy

## 2021-08-26 ENCOUNTER — Other Ambulatory Visit: Payer: Self-pay

## 2021-08-26 ENCOUNTER — Ambulatory Visit: Payer: Medicare Other | Attending: Plastic Surgery | Admitting: Occupational Therapy

## 2021-08-26 DIAGNOSIS — M79642 Pain in left hand: Secondary | ICD-10-CM

## 2021-08-26 DIAGNOSIS — M25642 Stiffness of left hand, not elsewhere classified: Secondary | ICD-10-CM | POA: Diagnosis not present

## 2021-08-26 DIAGNOSIS — M6281 Muscle weakness (generalized): Secondary | ICD-10-CM

## 2021-08-26 NOTE — Patient Instructions (Signed)
WEARING SCHEDULE:  Wear splint at ALL times except for hygiene care , when you remove your splint to clean finger tip, make sure to keep tip joint of ring finger fully extended   PURPOSE:  To prevent movement and for protection until injury can heal  CARE OF SPLINT:  Keep splint away from heat sources including: stove, radiator or furnace, or a car in sunlight. The splint can melt and will no longer fit you properly  Keep away from pets and children  Clean the splint with rubbing alcohol 1-2 times per day.  * During this time, make sure you also clean your hand/arm as instructed by your therapist and/or perform dressing changes as needed. Then dry hand/arm completely before replacing splint. (When cleaning hand/arm, keep it immobilized in same position until splint is replaced)  PRECAUTIONS/POTENTIAL PROBLEMS: *If you notice or experience increased pain, swelling, numbness, or a lingering reddened area from the splint: Contact your therapist immediately by calling [phone]. You must wear the splint for protection, but we will get you scheduled for adjustments as quickly as possible.  (If only straps or hooks need to be replaced and NO adjustments to the splint need to be made, just call the office ahead and let them know you are coming in)  If you have any medical concerns or signs of infection, please call your doctor immediately     MP Flexion (Active Isolated)   Bend __ring____ finger at large knuckle, keeping other fingers straight. Do not bend tips. Repeat _10-15___ times. Do __4-6__ sessions per day.  AROM: PIP Flexion / Extension   Pinch bottom knuckle of ___ring_____ finger of hand to prevent bending. Actively bend middle knuckle until stretch is felt. Hold __5__ seconds. Relax. Straighten finger as far as possible. Wear splint while exercising. Repeat __10-15__ times per set. Do _4-6___ sessions per day.   AROM: Finger Flexion / Extension   Actively bend fingers of   hand. Start with knuckles furthest from palm, and slowly make a fist. Hold __5__ seconds. Relax. Then straighten fingers as far as possible. Repeat _10-15___ times per set.  Do _4-6___ sessions per day.  Copyright  [employer]. All rights reserved.

## 2021-08-26 NOTE — Therapy (Signed)
[facility] [address] [place], [place], [address] Phone: [phone]   Fax:  [phone]  Occupational Therapy Evaluation  Patient Details  Name: Nicole Joyce MRN: [id number] Date of Birth: 1956-08-27 Referring Provider (OT): Dr. Arita Miss   Encounter Date: 08/26/2021   OT End of Session - 08/26/21 0948     Visit Number 1    Number of Visits 9    Date for OT Re-Evaluation 10/28/21    Authorization Type UHC    Authorization Time Period 9 weeks so that pt can be seen following MD appt prn, anticipate d/c after 4-6 visits    OT Start Time [phone]    OT Stop Time 0935    OT Time Calculation (min) 43 min             Past Medical History:  Diagnosis Date   Anemia    Asthma    Dysmenorrhea    Elevated lipids    Endometriosis    Hormone disorder    Infertility, female    PID (pelvic inflammatory disease)     Past Surgical History:  Procedure Laterality Date   ABDOMINAL SURGERY     exploratory laparotomy with treatment of endometriosis   PELVIC LAPAROSCOPY     ROTATOR CUFF REPAIR      There were no vitals filed for this visit.   Subjective Assessment - 08/26/21 0947     Subjective  Ptreports pain in her finger    Pertinent History injury 10/26 to DIP when dog bit finger tip, Pt saw Dr.Pace on 08/18/21, needs fingertip splint until next visit on 09/30/21    Patient Stated Goals finger splint    Currently in Pain? Yes    Pain Score 2     Pain Location Finger (Comment which one)    Pain Orientation Left    Pain Descriptors / Indicators Aching    Pain Type Acute pain    Pain Onset More than a month ago    Pain Frequency Intermittent    Aggravating Factors  movement    Pain Relieving Factors rest               [facility] OT Assessment - 08/26/21 0957       Assessment   Medical Diagnosis mallet deformity L ring finger    Referring Provider (OT) Dr. Arita Miss    Onset Date/Surgical Date 08/11/21    Hand  Dominance Right      Precautions   Precautions Other (comment)    Precaution Comments splint at all times except for hand hygeine, maintain DIP in full extension until next MD appt.   no heavy use of LUE     Home  Environment   Family/patient expects to be discharged to: Private residence    Lives With Spouse      Prior Function   Level of Independence Independent    Vocation Full time employment    Buyer, retail      ADL   ADL comments modified indpendent with all basic ADLS using dominant hand      Sensation   Light Touch Appears Intact      Edema   Edema mild in left ring finger      ROM / Strength   AROM / PROM / Strength AROM      AROM   Overall AROM  Deficits      Left Hand AROM   L Ring  MCP 0-90 75 Degrees  L Ring PIP 0-100 70 Degrees    L Ring DIP 0-70 --   no tested due to precautions     Hand Function   Left Hand Gross Grasp Impaired   80% composite flexion   Left Hand Grip (lbs) --   not tested due to precautions                     OT Treatments/Exercises (OP) - 08/26/21 0001       Splinting   Splinting Pt was fitted with a DIP extension splint for left ring finger with DIP joint in slight hyperextension and PIP joint free. Splint was wrapped with coban to avoid slipping off.  Pt verbalized understanding of wear, care and precautions.                    OT Education - 08/26/21 1012     Education Details splint wear, care and precautions for mallet finger injury, inital HEP see pt instructions.    Person(s) Educated Patient    Methods Explanation;Demonstration;Verbal cues;Handout    Comprehension Verbalized understanding;Returned demonstration;Verbal cues required              OT Short Term Goals - 08/26/21 1003       OT SHORT TERM GOAL #1   Title I with splint wear, care and precautions and maintaining DIP in full extension    Time 4    Period Weeks    Status New    Target Date 09/23/21       OT SHORT TERM GOAL #2   Title I with inital HEP.    Time 4    Period Weeks    Status New    Target Date 09/23/21               OT Long Term Goals - 08/26/21 1004       OT LONG TERM GOAL #1   Title I with updated HEP prn once cleared for A/ROM to DIP joint    Time 8    Period Weeks    Status New    Target Date 10/28/21      OT LONG TERM GOAL #2   Title Pt will report pain in LUE no greater than 2/10 for light functional use.    Time 8    Period Weeks    Status New                   Plan - 08/26/21 0951     Clinical Impression Statement Pt is a 65 y.o female with diagnosis of mallet deformity of left ring finger. Pt sustained a bite from her dog on 08/11/21. Pt saw MD on 08/18/21. He requests a DIP extension splint to allow ROM to PIP joint. Pt will need to be immobilized at DIP joint until she sees MD on 09/30/21. PMH: fall, chronic LBP, asthma. Pt presents with the following deficits: decreased ROM, decreased strength, pain, decreased LUE functional use. Pt can benefit from skilled OT to address these deficits in order to maximize pt's LUE functional use and I with ADLS/IADLs. Pt works as a Runner, broadcasting/film/video.    OT Occupational Profile and History Problem Focused Assessment - Including review of records relating to presenting problem    Occupational performance deficits (Please refer to evaluation for details): ADL's;IADL's;Work;Social Participation    Body Structure / Function / Physical Skills ADL;UE functional use;Flexibility;Pain;FMC;ROM;GMC;Coordination;Decreased knowledge of precautions;Sensation;IADL;Dexterity;Strength    Clinical Decision Making Limited  treatment options, no task modification necessary    Comorbidities Affecting Occupational Performance: May have comorbidities impacting occupational performance    Modification or Assistance to Complete Evaluation  No modification of tasks or assist necessary to complete eval    OT Frequency 1x / week    OT Duration 8  weeks   plus eval, POC written to accomodate for pt f/u visit 09/30/21   OT Treatment/Interventions Self-care/ADL training;Ultrasound;DME and/or AE instruction;Patient/family education;Scar mobilization;Passive range of motion;Paraffin;Cryotherapy;Fluidtherapy;Splinting;Contrast Bath;Electrical Stimulation;Moist Heat;Therapeutic exercise;Manual Therapy;Therapeutic activities;Neuromuscular education    Plan splint check, review precautions regarding maintining DIP joint in full extension until she sees MD, review HEP,  ask pt if she would like to schedule 1 visit after 09/30/21 or instruct her to call if she needs additional f/u    OT Home Exercise Plan issued A/ROM to PIP, MP and composite             Patient will benefit from skilled therapeutic intervention in order to improve the following deficits and impairments:   Body Structure / Function / Physical Skills: ADL, UE functional use, Flexibility, Pain, FMC, ROM, GMC, Coordination, Decreased knowledge of precautions, Sensation, IADL, Dexterity, Strength       Visit Diagnosis: Pain in left hand  Stiffness of left hand, not elsewhere classified  Muscle weakness (generalized)    Problem List Patient Active Problem List   Diagnosis Date Noted   Elevated lipids    Estrogen deficiency 11/18/2020   Chronic constipation 11/18/2020   Abnormal lung sounds 11/18/2020   Dyspareunia in female 11/18/2020   History of endometriosis 11/18/2020   Chronic left-sided low back pain with left-sided sciatica 02/03/2020   Smoker 02/03/2020   Mild intermittent asthma without complication 02/03/2020   Bilateral arm pain 02/03/2020   History of shingles 02/03/2020   Fall 01/05/2016    Robertha Staples, OT/L 08/26/2021, 1:48 PM  [facility] [address] [place], [place], [address] Phone: [phone]   Fax:  [phone]  Name: Nicole Joyce MRN: [id number] Date of Birth:  July 30, 1956

## 2021-08-31 ENCOUNTER — Ambulatory Visit: Payer: Medicare Other | Admitting: Occupational Therapy

## 2021-09-07 ENCOUNTER — Ambulatory Visit: Payer: Medicare Other | Admitting: Occupational Therapy

## 2021-09-07 DIAGNOSIS — M6281 Muscle weakness (generalized): Secondary | ICD-10-CM | POA: Diagnosis not present

## 2021-09-07 DIAGNOSIS — M25642 Stiffness of left hand, not elsewhere classified: Secondary | ICD-10-CM

## 2021-09-07 DIAGNOSIS — M79642 Pain in left hand: Secondary | ICD-10-CM | POA: Diagnosis not present

## 2021-09-07 NOTE — Therapy (Signed)
[facility] [address] [place], [place], [address] Phone: [phone]   Fax:  [phone]  Occupational Therapy Treatment  Patient Details  Name: Nicole Joyce MRN: [id number] Date of Birth: Mar 31, 1956 Referring Provider (OT): Dr. Arita Miss   Encounter Date: 09/07/2021   OT End of Session - 09/07/21 1538     Visit Number 2    Number of Visits 9    Date for OT Re-Evaluation 10/28/21    Authorization Type UHC    Authorization Time Period 9 weeks so that pt can be seen following MD appt prn, anticipate d/c after 4-6 visits    OT Start Time 1501    OT Stop Time 1515    OT Time Calculation (min) 14 min             Past Medical History:  Diagnosis Date   Anemia    Asthma    Dysmenorrhea    Elevated lipids    Endometriosis    Hormone disorder    Infertility, female    PID (pelvic inflammatory disease)     Past Surgical History:  Procedure Laterality Date   ABDOMINAL SURGERY     exploratory laparotomy with treatment of endometriosis   PELVIC LAPAROSCOPY     ROTATOR CUFF REPAIR      There were no vitals filed for this visit.   Subjective Assessment - 09/07/21 1531     Subjective  Pt reports her pain is better    Pertinent History injury 10/26 to DIP when dog bit finger tip, Pt saw Dr.Pace on 08/18/21, needs fingertip splint until next visit on 09/30/21    Patient Stated Goals finger splint    Currently in Pain? No/denies                     Treatment: PT arrived wearing splint wrapped in coban. Splint was carefully removed in order to maintain DIP extension. Finger was cleaned with alcohol gel and allowed to dry thoroughly and splint was cleaned.. Pt demonstrates mil maceration at volar surface of digit. Therapist reviewed importance of making sure finger is fully dry before applying splint,. Splint was applied with new strap and coban. A/ROM exercises performed to PIP and  DIP.             OT Education - 09/07/21 1534     Education Details splint wear, care and precautions for mallet finger injury, hand hygine and allowing finger to dry thoroughly before application, A/ROM for PIP and MP joints while wearing splint    Person(s) Educated Patient    Methods Explanation;Demonstration;Verbal cues    Comprehension Verbalized understanding;Returned demonstration;Verbal cues required              OT Short Term Goals - 08/26/21 1003       OT SHORT TERM GOAL #1   Title I with splint wear, care and precautions and maintaining DIP in full extension    Time 4    Period Weeks    Status New    Target Date 09/23/21      OT SHORT TERM GOAL #2   Title I with inital HEP.    Time 4    Period Weeks    Status New    Target Date 09/23/21               OT Long Term Goals - 08/26/21 1004       OT LONG TERM GOAL #1   Title  I with updated HEP prn once cleared for A/ROM to DIP joint    Time 8    Period Weeks    Status New    Target Date 10/28/21      OT LONG TERM GOAL #2   Title Pt will report pain in LUE no greater than 2/10 for light functional use.    Time 8    Period Weeks    Status New                   Plan - 09/07/21 1532     Clinical Impression Statement Pt is progressing towards goals. Pt's splint appears to be fitting well. Therapist reviewed with pt that she must maintain DIP joint in full exetension whenever splint is off.    OT Occupational Profile and History Problem Focused Assessment - Including review of records relating to presenting problem    Occupational performance deficits (Please refer to evaluation for details): ADL's;IADL's;Work;Social Participation    Body Structure / Function / Physical Skills ADL;UE functional use;Flexibility;Pain;FMC;ROM;GMC;Coordination;Decreased knowledge of precautions;Sensation;IADL;Dexterity;Strength    Clinical Decision Making Limited treatment options, no task modification  necessary    Comorbidities Affecting Occupational Performance: May have comorbidities impacting occupational performance    Modification or Assistance to Complete Evaluation  No modification of tasks or assist necessary to complete eval    OT Frequency 1x / week    OT Duration 8 weeks   plus eval, POC written to accomodate for pt f/u visit 09/30/21   OT Treatment/Interventions Self-care/ADL training;Ultrasound;DME and/or AE instruction;Patient/family education;Scar mobilization;Passive range of motion;Paraffin;Cryotherapy;Fluidtherapy;Splinting;Contrast Bath;Electrical Stimulation;Moist Heat;Therapeutic exercise;Manual Therapy;Therapeutic activities;Neuromuscular education    Plan progress with ROM if pt is cleared by MD    OT Home Exercise Plan issued A/ROM to PIP, MP and composite    Consulted and Agree with Plan of Care Patient             Patient will benefit from skilled therapeutic intervention in order to improve the following deficits and impairments:   Body Structure / Function / Physical Skills: ADL, UE functional use, Flexibility, Pain, FMC, ROM, GMC, Coordination, Decreased knowledge of precautions, Sensation, IADL, Dexterity, Strength       Visit Diagnosis: Pain in left hand  Stiffness of left hand, not elsewhere classified  Muscle weakness (generalized)    Problem List Patient Active Problem List   Diagnosis Date Noted   Elevated lipids    Estrogen deficiency 11/18/2020   Chronic constipation 11/18/2020   Abnormal lung sounds 11/18/2020   Dyspareunia in female 11/18/2020   History of endometriosis 11/18/2020   Chronic left-sided low back pain with left-sided sciatica 02/03/2020   Smoker 02/03/2020   Mild intermittent asthma without complication 02/03/2020   Bilateral arm pain 02/03/2020   History of shingles 02/03/2020   Fall 01/05/2016    Vedder Brittian, OT/L 09/07/2021, 3:39 PM  [facility] [address] [place], [place], [address] Phone: [phone]   Fax:  [phone]  Name: Nicole Joyce MRN: [id number] Date of Birth: 04/03/1956

## 2021-09-24 ENCOUNTER — Other Ambulatory Visit: Payer: Medicare Other

## 2021-09-30 ENCOUNTER — Ambulatory Visit: Payer: Medicare Other | Admitting: Plastic Surgery

## 2021-10-05 ENCOUNTER — Ambulatory Visit: Payer: Medicare Other | Attending: Plastic Surgery | Admitting: Occupational Therapy

## 2021-10-13 ENCOUNTER — Other Ambulatory Visit: Payer: Self-pay

## 2021-10-13 ENCOUNTER — Ambulatory Visit (INDEPENDENT_AMBULATORY_CARE_PROVIDER_SITE_OTHER): Payer: Medicare Other | Admitting: Plastic Surgery

## 2021-10-13 ENCOUNTER — Encounter: Payer: Self-pay | Admitting: Plastic Surgery

## 2021-10-13 DIAGNOSIS — S66909A Unspecified injury of unspecified muscle, fascia and tendon at wrist and hand level, unspecified hand, initial encounter: Secondary | ICD-10-CM

## 2021-10-13 DIAGNOSIS — M20019 Mallet finger of unspecified finger(s): Secondary | ICD-10-CM

## 2021-10-13 NOTE — Progress Notes (Signed)
° °  Referring Provider Avanell Shackleton, PA-C No address on file   CC:  Chief Complaint  Patient presents with   Follow-up      Nicole Joyce is an 65 y.o. female.  HPI: Patient presents 2 months out from mallet injury of her left ring finger.  Overall she feels like things are going well.  She seen therapy and they have given her a DIP extension splint.  She feels like that is going well and is worn it every day.  She is here for follow-up.  Review of Systems General: Denies fevers and chills  Physical Exam Vitals with BMI 08/18/2021 08/11/2021 08/05/2021  Height 5' 4.5" - 5' 4.5"  Weight 158 lbs 10 oz - 157 lbs  BMI 26.81 - 26.54  Systolic 138 135 (No Data)  Diastolic 84 85 (No Data)  Pulse 70 76 -    General:  No acute distress,  Alert and oriented, Non-Toxic, Normal speech and affect Left hand: Fingers well-perfused normal capillary refill palp radial pulse.  Sensation intact throughout.  She has good range of motion with the exception of being understandably stiff at the DIP joint.  It is held in almost complete extension.  She can flex it 10 degrees or so.  No longer has the mallet posture.  Assessment/Plan Patient presents after satisfactory treatment of a mallet injury.  I have asked her to continue to wear the splint at night but during the day she can do her regular activities.  She is planning to go back to therapy and they may be able to give her some exercises to finish out her range of motion recovery.  I will plan to see her again in 6 weeks.  All of her questions were answered.  [name] 10/13/2021, 1:08 PM

## 2021-10-16 ENCOUNTER — Other Ambulatory Visit: Payer: Self-pay | Admitting: Family Medicine

## 2021-10-16 DIAGNOSIS — Z9189 Other specified personal risk factors, not elsewhere classified: Secondary | ICD-10-CM

## 2021-10-16 DIAGNOSIS — E7800 Pure hypercholesterolemia, unspecified: Secondary | ICD-10-CM

## 2021-10-19 ENCOUNTER — Ambulatory Visit: Payer: Medicare HMO | Attending: Plastic Surgery | Admitting: Occupational Therapy

## 2021-10-19 DIAGNOSIS — M25642 Stiffness of left hand, not elsewhere classified: Secondary | ICD-10-CM | POA: Insufficient documentation

## 2021-10-19 DIAGNOSIS — R69 Illness, unspecified: Secondary | ICD-10-CM | POA: Diagnosis not present

## 2021-10-19 DIAGNOSIS — M79642 Pain in left hand: Secondary | ICD-10-CM | POA: Insufficient documentation

## 2021-10-19 DIAGNOSIS — M6281 Muscle weakness (generalized): Secondary | ICD-10-CM | POA: Insufficient documentation

## 2021-10-19 NOTE — Telephone Encounter (Signed)
Called and left vm for pt to call back to schedule a visit

## 2021-10-19 NOTE — Telephone Encounter (Signed)
Needs medicare scheduled with Burnard Hawthorne

## 2021-10-20 ENCOUNTER — Encounter: Payer: Medicare Other | Admitting: Occupational Therapy

## 2021-11-09 ENCOUNTER — Other Ambulatory Visit: Payer: Self-pay

## 2021-11-09 ENCOUNTER — Ambulatory Visit: Payer: Medicare HMO | Admitting: Occupational Therapy

## 2021-11-09 ENCOUNTER — Encounter: Payer: Self-pay | Admitting: Occupational Therapy

## 2021-11-09 DIAGNOSIS — M6281 Muscle weakness (generalized): Secondary | ICD-10-CM | POA: Diagnosis not present

## 2021-11-09 DIAGNOSIS — M79642 Pain in left hand: Secondary | ICD-10-CM | POA: Diagnosis not present

## 2021-11-09 DIAGNOSIS — M25642 Stiffness of left hand, not elsewhere classified: Secondary | ICD-10-CM

## 2021-11-09 NOTE — Patient Instructions (Signed)
Wear your splint for 2 hrs on and 2 hours off during the daytime repeatedly, gradually decrease wear time, as long as finger stays straight, continue wearing all night  AROM: PIP Flexion / Extension   Pinch bottom knuckle of ___index_____ finger of hand to prevent bending. Actively bend middle knuckle until stretch is felt. Hold __5__ seconds. Relax. Straighten finger as far as possible. Repeat __10-15__ times per set. Do _4-6___ sessions per day.   AROM: DIP Flexion / Extension   Pinch middle knuckle of ___index_____ finger of  hand to prevent bending. Bend end knuckle until stretch is felt. Hold _5___ seconds. Relax. Straighten finger as far as possible. Repeat _10-15___ times per set.  Do _4-6___ sessions per day.

## 2021-11-10 NOTE — Therapy (Signed)
[facility] [address] [place], [place], [address] Phone: [phone]   Fax:  [phone]  Occupational Therapy Treatment  Patient Details  Name: Nicole Joyce MRN: [id number] Date of Birth: [birth year] Referring Provider (OT): Dr. Claudia Desanctis   Encounter Date: 11/09/2021   OT End of Session - 11/10/21 1427     Visit Number 3    Number of Visits 11    Date for OT Re-Evaluation 01/05/22    Authorization Type UHC Medicare    Authorization Time Period 8 weeks so pt can be seen fter MD visit    OT Start Time 1450    OT Stop Time 1525    OT Time Calculation (min) 35 min             Past Medical History:  Diagnosis Date   Anemia    Asthma    Dysmenorrhea    Elevated lipids    Endometriosis    Hormone disorder    Infertility, female    PID (pelvic inflammatory disease)     Past Surgical History:  Procedure Laterality Date   ABDOMINAL SURGERY     exploratory laparotomy with treatment of endometriosis   PELVIC LAPAROSCOPY     ROTATOR CUFF REPAIR      There were no vitals filed for this visit.   Subjective Assessment - 11/09/21 1505     Subjective  Pt reports seeing MD    Pertinent History injury 10/26 to DIP when dog bit finger tip, Pt saw Dr.Pace on 08/18/21, needs fingertip splint until next visit on 09/30/21    Patient Stated Goals finger splint    Currently in Pain? No/denies                           Treatment: Minor adjustment to splint strapping with foam piece added to provide greater extension and prevent splint from sliding. Pt reports increased comfort with adjustment.  Index finger DIP  flexion 50*/ extension -15     OT Treatment/Education - 11/10/21 0742     Education Details splint wear, care and precautions , pt was instructed to wear 2 hrs on, 2 hours off and to gradually wean from splint during daytime, and to continue wearing at night as pt continues to demonstrate DIP  extensor lag, Pt was instructed in A/ROM DIP and PIP flexion as well as gentle P/ROM finger extension    Person(s) Educated Patient    Methods Explanation;Demonstration;Verbal cues;Handout    Comprehension Verbalized understanding;Returned demonstration;Verbal cues required              OT Short Term Goals - 11/09/21 1506       OT SHORT TERM GOAL #1   Title I with splint wear, care and precautions and maintaining DIP in full extension    Time 4    Period Weeks    Status Achieved    Target Date 09/23/21      OT SHORT TERM GOAL #2   Title I with inital HEP.    Time 4    Period Weeks    Status Achieved    Target Date --      OT SHORT TERM GOAL #3   Title Pt will demonstrate an extensor lag no greater than -10 for increased LUE functional use    Baseline DIP flexion/ extension: 50/-15    Time 4    Period Weeks    Status New  OT Long Term Goals - 11/09/21 1506       OT LONG TERM GOAL #1   Title I with updated HEP prn once cleared for A/ROM to DIP joint    Time 8    Period Weeks    Status On-going    Target Date 10/28/21      OT LONG TERM GOAL #2   Title Pt will report pain in LUE no greater than 2/10 for light functional use.    Time 8    Period Weeks    Status Achieved                   Plan - 11/10/21 0744     Clinical Impression Statement Pt returns to occupational therapy following a long delay as pt missed her last appointment. Pt can benefit from continued skilled occupational therapy to address pt's ROM and any additional splinting needs. Pt was instructed in exercises for DIP flexion/ extension.    OT Occupational Profile and History Problem Focused Assessment - Including review of records relating to presenting problem    Occupational performance deficits (Please refer to evaluation for details): ADL's;IADL's;Work;Social Participation    Body Structure / Function / Physical Skills ADL;UE functional  use;Flexibility;Pain;FMC;ROM;GMC;Coordination;Decreased knowledge of precautions;Sensation;IADL;Dexterity;Strength    Clinical Decision Making Limited treatment options, no task modification necessary    Comorbidities Affecting Occupational Performance: May have comorbidities impacting occupational performance    Modification or Assistance to Complete Evaluation  No modification of tasks or assist necessary to complete eval    OT Frequency 1x / week    OT Duration 8 weeks   written so that pt can return after seeing MD   OT Treatment/Interventions Self-care/ADL training;Ultrasound;DME and/or AE instruction;Patient/family education;Scar mobilization;Passive range of motion;Paraffin;Cryotherapy;Fluidtherapy;Splinting;Contrast Bath;Electrical Stimulation;Moist Heat;Therapeutic exercise;Manual Therapy;Therapeutic activities;Neuromuscular education    Plan Pt to return after seeing MD next visit prn, continue A/ROM exercises    OT Home Exercise Plan issued A/ROM to PIP, MP and composite    Consulted and Agree with Plan of Care Patient             Patient will benefit from skilled therapeutic intervention in order to improve the following deficits and impairments:   Body Structure / Function / Physical Skills: ADL, UE functional use, Flexibility, Pain, FMC, ROM, GMC, Coordination, Decreased knowledge of precautions, Sensation, IADL, Dexterity, Strength       Visit Diagnosis: Pain in left hand - Plan: Ot plan of care cert/re-cert  Stiffness of left hand, not elsewhere classified - Plan: Ot plan of care cert/re-cert  Muscle weakness (generalized) - Plan: Ot plan of care cert/re-cert    Problem List Patient Active Problem List   Diagnosis Date Noted   Elevated lipids    Estrogen deficiency 11/18/2020   Chronic constipation 11/18/2020   Abnormal lung sounds 11/18/2020   Dyspareunia in female 11/18/2020   History of endometriosis 11/18/2020   Chronic left-sided low back pain with  left-sided sciatica 02/03/2020   Smoker 02/03/2020   Mild intermittent asthma without complication [id number]   Bilateral arm pain 02/03/2020   History of shingles 02/03/2020   Fall 01/05/2016    Sylvain Hasten, OT 11/10/2021, 2:30 PM Theone Murdoch, OTR/L [phone] Phone: [phone] 2:30 PM 11/10/21  [facility] [address] [place], [place], [address] Phone: [phone]   Fax:  [phone]  Name: Nicole Joyce MRN: [id number] Date of Birth: 1955/11/17

## 2021-11-24 ENCOUNTER — Ambulatory Visit: Payer: Medicare Other | Admitting: Plastic Surgery

## 2021-11-30 ENCOUNTER — Ambulatory Visit: Payer: Medicare HMO | Admitting: Occupational Therapy

## 2021-12-02 ENCOUNTER — Other Ambulatory Visit: Payer: Self-pay

## 2021-12-02 ENCOUNTER — Encounter: Payer: Self-pay | Admitting: Physician Assistant

## 2021-12-02 ENCOUNTER — Ambulatory Visit (INDEPENDENT_AMBULATORY_CARE_PROVIDER_SITE_OTHER): Payer: Medicare HMO | Admitting: Physician Assistant

## 2021-12-02 VITALS — BP 122/80 | HR 90 | Temp 98.0°F | Ht 65.0 in | Wt 161.0 lb

## 2021-12-02 DIAGNOSIS — J3089 Other allergic rhinitis: Secondary | ICD-10-CM | POA: Diagnosis not present

## 2021-12-02 DIAGNOSIS — Z23 Encounter for immunization: Secondary | ICD-10-CM

## 2021-12-02 DIAGNOSIS — Z0000 Encounter for general adult medical examination without abnormal findings: Secondary | ICD-10-CM | POA: Diagnosis not present

## 2021-12-02 DIAGNOSIS — Z9189 Other specified personal risk factors, not elsewhere classified: Secondary | ICD-10-CM | POA: Diagnosis not present

## 2021-12-02 DIAGNOSIS — Z9109 Other allergy status, other than to drugs and biological substances: Secondary | ICD-10-CM | POA: Insufficient documentation

## 2021-12-02 DIAGNOSIS — F418 Other specified anxiety disorders: Secondary | ICD-10-CM | POA: Diagnosis not present

## 2021-12-02 DIAGNOSIS — Z780 Asymptomatic menopausal state: Secondary | ICD-10-CM | POA: Insufficient documentation

## 2021-12-02 DIAGNOSIS — E785 Hyperlipidemia, unspecified: Secondary | ICD-10-CM

## 2021-12-02 HISTORY — DX: Encounter for immunization: Z23

## 2021-12-02 MED ORDER — ATORVASTATIN CALCIUM 10 MG PO TABS: 10.0000 mg | ORAL_TABLET | Freq: Every day | ORAL | 2 refills | Status: DC

## 2021-12-02 MED ORDER — BUSPIRONE HCL 5 MG PO TABS: 5.0000 mg | ORAL_TABLET | Freq: Two times a day (BID) | ORAL | 2 refills | Status: DC

## 2021-12-02 NOTE — Progress Notes (Signed)
Nicole Joyce is a 66 y.o. female who presents for annual wellness visit and follow-up on chronic medical conditions.  She has the following concerns:  Reports slight headaches and dizziness; drinks minimal water each day; has allergic rhinitis.   Also reports that she overthinks during the day and night and physically feels like she needs to constantly move.   Immunizations and Health Maintenance Immunization History  Administered Date(s) Administered   PFIZER(Purple Top)SARS-COV-2 Vaccination 12/14/2019, 01/04/2020   Tdap 08/11/2021   Health Maintenance Due  Topic Date Due   COVID-19 Vaccine (3 - Booster for Pfizer series) 02/29/2020   DEXA SCAN  Never done    Last Pap smear: 11/18/20 Last mammogram: 02/26/21 Last colonoscopy: 07/13/15 Last DEXA: will call to reschedule Dentist: Dr. Victoriano Lain Ophtho: Dr. Caryn [facility] Exercise: was walking and does hand weights   Other doctors caring for patient include:  Advanced directives: Does Patient Have a Medical Advance Directive?: No Would patient like information on creating a medical advance directive?: No - Patient declined  Depression screen:  See questionnaire below.  Depression screen [facility] 2/9 12/02/2021 11/18/2020  Decreased Interest 0 0  Down, Depressed, Hopeless 0 0  PHQ - 2 Score 0 0    Fall Risk Screen: see questionnaire below. Fall Risk  12/02/2021 11/18/2020  Falls in the past year? 1 1  Number falls in past yr: 1 0  Comment 3 times -  Injury with Fall? 0 0  Risk for fall due to : Other (Comment) -  Follow up Falls evaluation completed -    ADL screen:  See questionnaire below Functional Status Survey: Is the patient deaf or have difficulty hearing?: No Does the patient have difficulty seeing, even when wearing glasses/contacts?: No Does the patient have difficulty concentrating, remembering, or making decisions?: Yes (sometimes diffcultly concentrating) Does the patient have difficulty dressing or bathing?: No Does the  patient have difficulty doing errands alone such as visiting a doctor's office or shopping?: No   Review of Systems Constitutional: -, -unexpected weight change, -anorexia, -fatigue Allergy: -sneezing, -itching, +congestion Dermatology: denies changing moles, rash, lumps ENT: +runny nose, -ear pain, -sore throat,  Cardiology:  -chest pain, -palpitations, -orthopnea, Respiratory: -cough, -shortness of breath, -dyspnea on exertion, -wheezing,  Gastroenterology: -abdominal pain, -nausea, -vomiting, -diarrhea, -constipation, -dysphagia Hematology: -bleeding or bruising problems Musculoskeletal: -arthralgias, -myalgias, -joint swelling, -back pain, - Ophthalmology: -vision changes,  Urology: -dysuria, -difficulty urinating,  -urinary frequency, -urgency, incontinence Neurology: -, -numbness, , -memory loss, -falls, -dizziness    PHYSICAL EXAM:  BP 122/80    Pulse 90    Temp 98 F (36.7 C)    Ht 5\' 5"  (1.651 m)    Wt 161 lb (73 kg)    BMI 26.79 kg/m   General Appearance: Alert, cooperative, no distress, appears stated age Head: Normocephalic, without obvious abnormality, atraumatic Eyes: PERRL, conjunctiva/corneas clear, EOM's intact, fundi benign Ears: Normal TM's and external ear canals Nose: wearing a mask Throat: wearing a mask Neck: Supple, no lymphadenopathy;  thyroid:  no enlargement/tenderness/nodules; no carotid bruit or JVD Lungs: Clear to auscultation bilaterally without wheezes, rales or ronchi; respirations unlabored Heart: Regular rate and rhythm, S1 and S2 normal, no murmur, rubor gallop Abdomen: Soft, non-tender, nondistended, normoactive bowel sounds,  no masses, no hepatosplenomegaly Extremities: No clubbing, cyanosis or edema Pulses: 2+ and symmetric all extremities Skin:  Skin color, texture, turgor normal, no rashes or lesions Lymph nodes: Cervical, supraclavicular, and axillary nodes normal Neurologic:  wearing a mask Psych: Normal  mood, affect, hygiene and  grooming.  Assessment & Plan:  Annual wellness exam - Stable, continue current management, return in 1 year for annual exam  Elevated lipids / high risk of MI  - Stable, continue current management, eat a low fat diet  Allergic rhinitis - Stable, continue current management, increase water intake, OTC antihistamines and decongestants as needed; nasal saline rinse and nasal steroids as needed  Situational anxiety - stable, start buspar 5 mg bid  Need for COVID vaccine - covid vaccine given today  Discussed monthly self breast exams and yearly mammograms; at least 30 minutes of aerobic activity at least 5 days/week and weight-bearing exercise 2x/week; proper sunscreen use reviewed; healthy diet, including goals of calcium and vitamin D intake and alcohol recommendations (less than or equal to 1 drink/day) reviewed; regular seatbelt use; changing batteries in smoke detectors.  Immunization recommendations discussed.  Colonoscopy recommendations reviewed   Medicare Attestation I have personally reviewed: The patient's medical and social history Their use of alcohol, tobacco or illicit drugs Their current medications and supplements The patient's functional ability including ADLs,fall risks, home safety risks, cognitive, and hearing and visual impairment Diet and physical activities Evidence for depression or mood disorders  The patient's weight, height, and BMI have been recorded in the chart.  I have made referrals, counseling, and provided education to the patient based on review of the above and I have provided the patient with a written personalized care plan for preventive services.     Jake Shark, PA-C   12/02/2021

## 2021-12-02 NOTE — Patient Instructions (Addendum)
You can call to reschedule the DEXA Bone Density test:  [facility] [address] [place], [place] [address] Ph: [phone] Fax: [phone]   You can take an over the counter antihistamine to help with allergic rhinitis / itching / hives: NON-DROWSY Allegra (Fexofenadine) 180 mg daily or NON-Drowsy Claritin (Loratidine) 10 mg daily  or DROWSY Benadryl (Diphenhydramine) 25 mg as directed or Zyrtec (Cetirizine) 10 mg daily. You can go to a store with a pharmacy and ask them to help you find these medicines.  You can also take a decongestant to help with runny nose.

## 2021-12-03 DIAGNOSIS — Z9189 Other specified personal risk factors, not elsewhere classified: Secondary | ICD-10-CM | POA: Diagnosis not present

## 2021-12-03 DIAGNOSIS — F418 Other specified anxiety disorders: Secondary | ICD-10-CM | POA: Diagnosis not present

## 2021-12-03 DIAGNOSIS — E785 Hyperlipidemia, unspecified: Secondary | ICD-10-CM | POA: Diagnosis not present

## 2021-12-03 DIAGNOSIS — Z0000 Encounter for general adult medical examination without abnormal findings: Secondary | ICD-10-CM | POA: Diagnosis not present

## 2021-12-03 DIAGNOSIS — J3089 Other allergic rhinitis: Secondary | ICD-10-CM | POA: Diagnosis not present

## 2021-12-03 DIAGNOSIS — Z23 Encounter for immunization: Secondary | ICD-10-CM

## 2021-12-03 LAB — CBC WITH DIFFERENTIAL/PLATELET
Basophils Absolute: 0.1 x10E3/uL (ref 0.0–0.2)
Basos: 1 %
EOS (ABSOLUTE): 0 x10E3/uL (ref 0.0–0.4)
Eos: 1 %
Hematocrit: 39.3 % (ref 34.0–46.6)
Hemoglobin: 13.1 g/dL (ref 11.1–15.9)
Immature Grans (Abs): 0 x10E3/uL (ref 0.0–0.1)
Immature Granulocytes: 0 %
Lymphocytes Absolute: 2.9 x10E3/uL (ref 0.7–3.1)
Lymphs: 49 %
MCH: 29.6 pg (ref 26.6–33.0)
MCHC: 33.3 g/dL (ref 31.5–35.7)
MCV: 89 fL (ref 79–97)
Monocytes Absolute: 0.4 x10E3/uL (ref 0.1–0.9)
Monocytes: 8 %
Neutrophils Absolute: 2.3 x10E3/uL (ref 1.4–7.0)
Neutrophils: 41 %
Platelets: 338 x10E3/uL (ref 150–450)
RBC: 4.43 x10E6/uL (ref 3.77–5.28)
RDW: 13.7 % (ref 11.7–15.4)
WBC: 5.8 x10E3/uL (ref 3.4–10.8)

## 2021-12-03 LAB — COMPREHENSIVE METABOLIC PANEL
ALT: 11 IU/L (ref 0–32)
AST: 17 IU/L (ref 0–40)
Albumin/Globulin Ratio: 2.2 (ref 1.2–2.2)
Albumin: 4.9 g/dL — ABNORMAL HIGH (ref 3.8–4.8)
Alkaline Phosphatase: 94 IU/L (ref 44–121)
BUN/Creatinine Ratio: 15 (ref 12–28)
BUN: 13 mg/dL (ref 8–27)
Bilirubin Total: 0.9 mg/dL (ref 0.0–1.2)
CO2: 26 mmol/L (ref 20–29)
Calcium: 10 mg/dL (ref 8.7–10.3)
Chloride: 104 mmol/L (ref 96–106)
Creatinine, Ser: 0.86 mg/dL (ref 0.57–1.00)
Globulin, Total: 2.2 g/dL (ref 1.5–4.5)
Glucose: 95 mg/dL (ref 70–99)
Potassium: 4.8 mmol/L (ref 3.5–5.2)
Sodium: 142 mmol/L (ref 134–144)
Total Protein: 7.1 g/dL (ref 6.0–8.5)
eGFR: 74 mL/min/{1.73_m2} (ref >59)

## 2021-12-03 LAB — LIPID PANEL
Chol/HDL Ratio: 1.8 ratio (ref 0.0–4.4)
Cholesterol, Total: 155 mg/dL (ref 100–199)
HDL: 87 mg/dL (ref >39)
LDL Chol Calc (NIH): 55 mg/dL (ref 0–99)
Triglycerides: 66 mg/dL (ref 0–149)
VLDL Cholesterol Cal: 13 mg/dL (ref 5–40)

## 2021-12-03 LAB — TSH+FREE T4
Free T4: 1.31 ng/dL (ref 0.82–1.77)
TSH: 1.18 u[IU]/mL (ref 0.450–4.500)

## 2022-01-03 ENCOUNTER — Other Ambulatory Visit: Payer: Self-pay | Admitting: Physician Assistant

## 2022-01-03 ENCOUNTER — Telehealth: Payer: Self-pay

## 2022-01-03 DIAGNOSIS — F418 Other specified anxiety disorders: Secondary | ICD-10-CM

## 2022-01-03 MED ORDER — BUSPIRONE HCL 5 MG PO TABS: 5.0000 mg | ORAL_TABLET | Freq: Two times a day (BID) | ORAL | 2 refills | Status: DC

## 2022-01-03 NOTE — Telephone Encounter (Signed)
Refill ordered

## 2022-01-03 NOTE — Telephone Encounter (Signed)
Received fax from [facility] for a refill on the pts. Buspirone. Pts. Last apt was 12/02/21 and next apt. 12/05/22. ?

## 2022-01-26 DIAGNOSIS — E559 Vitamin D deficiency, unspecified: Secondary | ICD-10-CM | POA: Diagnosis not present

## 2022-01-26 DIAGNOSIS — H409 Unspecified glaucoma: Secondary | ICD-10-CM | POA: Diagnosis not present

## 2022-01-26 DIAGNOSIS — F17200 Nicotine dependence, unspecified, uncomplicated: Secondary | ICD-10-CM | POA: Diagnosis not present

## 2022-01-26 DIAGNOSIS — M5432 Sciatica, left side: Secondary | ICD-10-CM | POA: Diagnosis not present

## 2022-01-26 DIAGNOSIS — E663 Overweight: Secondary | ICD-10-CM | POA: Diagnosis not present

## 2022-01-26 DIAGNOSIS — E785 Hyperlipidemia, unspecified: Secondary | ICD-10-CM | POA: Diagnosis not present

## 2022-01-26 DIAGNOSIS — Z1159 Encounter for screening for other viral diseases: Secondary | ICD-10-CM | POA: Diagnosis not present

## 2022-01-26 DIAGNOSIS — F909 Attention-deficit hyperactivity disorder, unspecified type: Secondary | ICD-10-CM | POA: Diagnosis not present

## 2022-01-26 DIAGNOSIS — Z79899 Other long term (current) drug therapy: Secondary | ICD-10-CM | POA: Diagnosis not present

## 2022-02-03 ENCOUNTER — Telehealth: Payer: Self-pay

## 2022-02-03 ENCOUNTER — Telehealth: Payer: Self-pay | Admitting: Physician Assistant

## 2022-02-03 ENCOUNTER — Other Ambulatory Visit: Payer: Self-pay | Admitting: Physician Assistant

## 2022-02-03 DIAGNOSIS — F418 Other specified anxiety disorders: Secondary | ICD-10-CM

## 2022-02-03 MED ORDER — BUPROPION HCL 75 MG PO TABS: 75.0000 mg | ORAL_TABLET | Freq: Every day | ORAL | 3 refills | Status: DC

## 2022-02-03 NOTE — Telephone Encounter (Signed)
Pt called and states that she is unable to take to Buspar. She states it makes her dizzy. Please advise pt at [phone].  ?

## 2022-02-03 NOTE — Telephone Encounter (Signed)
Pt was advised and she says thank you.  ?

## 2022-02-03 NOTE — Telephone Encounter (Signed)
Wellbutrin 75 mg daily ordered. Thanks.

## 2022-02-03 NOTE — Telephone Encounter (Signed)
Please call patient to tell her that she can stop taking Buspar if it makes her dizzy.  ? ?If she wants to try a daily medicine to help anxiety and over thinking, then I can prescribe Wellbutrin.

## 2022-02-03 NOTE — Telephone Encounter (Signed)
Pt called back to advise that she will stop the Buspar and she would like the Wellbutrin called in. [facility] ?

## 2022-02-11 DIAGNOSIS — I700 Atherosclerosis of aorta: Secondary | ICD-10-CM | POA: Diagnosis not present

## 2022-02-11 DIAGNOSIS — E663 Overweight: Secondary | ICD-10-CM | POA: Diagnosis not present

## 2022-02-11 DIAGNOSIS — F17200 Nicotine dependence, unspecified, uncomplicated: Secondary | ICD-10-CM | POA: Diagnosis not present

## 2022-02-11 DIAGNOSIS — Z0000 Encounter for general adult medical examination without abnormal findings: Secondary | ICD-10-CM | POA: Diagnosis not present

## 2022-02-11 DIAGNOSIS — Z0001 Encounter for general adult medical examination with abnormal findings: Secondary | ICD-10-CM | POA: Diagnosis not present

## 2022-02-25 ENCOUNTER — Other Ambulatory Visit: Payer: Self-pay | Admitting: Physician Assistant

## 2022-02-25 MED ORDER — BUPROPION HCL 75 MG PO TABS: 75.0000 mg | ORAL_TABLET | Freq: Every day | ORAL | 1 refills | Status: DC

## 2022-02-25 NOTE — Telephone Encounter (Signed)
Pharmacy is requesting 90 day supply on Wellbutrin ?

## 2022-04-15 ENCOUNTER — Encounter: Payer: Self-pay | Admitting: Internal Medicine

## 2022-07-12 ENCOUNTER — Other Ambulatory Visit: Payer: Self-pay

## 2022-07-12 ENCOUNTER — Emergency Department (HOSPITAL_COMMUNITY): Payer: Medicare HMO

## 2022-07-12 ENCOUNTER — Emergency Department (HOSPITAL_COMMUNITY)
Admission: EM | Admit: 2022-07-12 | Discharge: 2022-07-12 | Disposition: A | Discharge status: Home / Self Care | Payer: Medicare HMO | Attending: Student | Admitting: Student

## 2022-07-12 ENCOUNTER — Encounter (HOSPITAL_COMMUNITY): Payer: Self-pay | Admitting: Emergency Medicine

## 2022-07-12 DIAGNOSIS — N132 Hydronephrosis with renal and ureteral calculous obstruction: Secondary | ICD-10-CM | POA: Diagnosis not present

## 2022-07-12 DIAGNOSIS — N200 Calculus of kidney: Secondary | ICD-10-CM

## 2022-07-12 DIAGNOSIS — M545 Low back pain, unspecified: Secondary | ICD-10-CM | POA: Diagnosis present

## 2022-07-12 LAB — COMPREHENSIVE METABOLIC PANEL
ALT: 14 U/L (ref 0–44)
AST: 18 U/L (ref 15–41)
Albumin: 4.2 g/dL (ref 3.5–5.0)
Alkaline Phosphatase: 77 U/L (ref 38–126)
Anion gap: 6 (ref 5–15)
BUN: 9 mg/dL (ref 8–23)
CO2: 31 mmol/L (ref 22–32)
Calcium: 9.2 mg/dL (ref 8.9–10.3)
Chloride: 103 mmol/L (ref 98–111)
Creatinine, Ser: 0.91 mg/dL (ref 0.44–1.00)
GFR, Estimated: >60 mL/min (ref >60)
Glucose, Bld: 147 mg/dL — ABNORMAL HIGH (ref 70–99)
Potassium: 3.3 mmol/L — ABNORMAL LOW (ref 3.5–5.1)
Sodium: 140 mmol/L (ref 135–145)
Total Bilirubin: 1.1 mg/dL (ref 0.3–1.2)
Total Protein: 7.2 g/dL (ref 6.5–8.1)

## 2022-07-12 LAB — URINALYSIS, ROUTINE W REFLEX MICROSCOPIC
Bilirubin Urine: NEGATIVE
Glucose, UA: NEGATIVE mg/dL
Ketones, ur: NEGATIVE mg/dL
Leukocytes,Ua: NEGATIVE
Nitrite: NEGATIVE
Protein, ur: NEGATIVE mg/dL
Specific Gravity, Urine: 1.015 (ref 1.005–1.030)
pH: 7.5 (ref 5.0–8.0)

## 2022-07-12 LAB — CBC WITH DIFFERENTIAL/PLATELET
Abs Immature Granulocytes: 0.02 10*3/uL (ref 0.00–0.07)
Basophils Absolute: 0.1 10*3/uL (ref 0.0–0.1)
Basophils Relative: 1 %
Eosinophils Absolute: 0 10*3/uL (ref 0.0–0.5)
Eosinophils Relative: 0 %
HCT: 39.7 % (ref 36.0–46.0)
Hemoglobin: 13.3 g/dL (ref 12.0–15.0)
Immature Granulocytes: 0 %
Lymphocytes Relative: 27 %
Lymphs Abs: 2.6 10*3/uL (ref 0.7–4.0)
MCH: 29.3 pg (ref 26.0–34.0)
MCHC: 33.5 g/dL (ref 30.0–36.0)
MCV: 87.4 fL (ref 80.0–100.0)
Monocytes Absolute: 0.7 10*3/uL (ref 0.1–1.0)
Monocytes Relative: 7 %
Neutro Abs: 6.4 10*3/uL (ref 1.7–7.7)
Neutrophils Relative %: 65 %
Platelets: 292 10*3/uL (ref 150–400)
RBC: 4.54 MIL/uL (ref 3.87–5.11)
RDW: 14.8 % (ref 11.5–15.5)
WBC: 9.8 10*3/uL (ref 4.0–10.5)
nRBC: 0 % (ref 0.0–0.2)

## 2022-07-12 LAB — URINALYSIS, MICROSCOPIC (REFLEX): Bacteria, UA: NONE SEEN

## 2022-07-12 LAB — LIPASE, BLOOD: Lipase: 23 U/L (ref 11–51)

## 2022-07-12 MED ORDER — ONDANSETRON 4 MG PO TBDP: 4.0000 mg | ORAL_TABLET | Freq: Three times a day (TID) | ORAL | 0 refills | Status: DC | PRN

## 2022-07-12 MED ORDER — HYDROCODONE-ACETAMINOPHEN 5-325 MG PO TABS: 1.0000 | ORAL_TABLET | ORAL | 0 refills | Status: DC | PRN

## 2022-07-12 MED ORDER — TAMSULOSIN HCL 0.4 MG PO CAPS: 0.4000 mg | ORAL_CAPSULE | Freq: Every day | ORAL | 0 refills | Status: DC

## 2022-07-12 NOTE — ED Provider Notes (Signed)
[facility] EMERGENCY DEPARTMENT Provider Note   CSN: [id number] Arrival date & time: 07/12/22  0357     History  Chief Complaint  Patient presents with   Back Pain    Nicole Joyce is a 66 y.o. female.  Patient reports she awoke with pain in her right low back this a.m.  Pt reports she has had back pain but pain is different.  Pt reports she vomited earlier.  Patient denies any history of kidney stones she denies any history of kidney disease patient denies any UTI symptoms.  Patient denies any diarrhea.  She is not having any abdominal pain   The history is provided by the patient. No language interpreter was used.  Flank Pain This is a new problem. The current episode started 3 to 5 hours ago. The problem occurs constantly. The problem has not changed since onset.Nothing aggravates the symptoms. Nothing relieves the symptoms. She has tried nothing for the symptoms.       Home Medications Prior to Admission medications   Medication Sig Start Date End Date Taking? Authorizing Provider  HYDROcodone-acetaminophen (NORCO/VICODIN) 5-325 MG tablet Take 1 tablet by mouth every 4 (four) hours as needed for moderate pain. 07/12/22 07/12/23 Yes Cheron Schaumann K, PA-C  ondansetron (ZOFRAN-ODT) 4 MG disintegrating tablet Take 1 tablet (4 mg total) by mouth every 8 (eight) hours as needed for nausea or vomiting. 07/12/22  Yes Elson Areas, PA-C  tamsulosin (FLOMAX) 0.4 MG CAPS capsule Take 1 capsule (0.4 mg total) by mouth daily. 07/12/22  Yes Cheron Schaumann K, PA-C  albuterol (VENTOLIN HFA) 108 (90 Base) MCG/ACT inhaler Inhale 2 puffs into the lungs every 6 (six) hours as needed for wheezing or shortness of breath. 08/05/21   Joselyn Arrow, MD  atorvastatin (LIPITOR) 10 MG tablet Take 1 tablet (10 mg total) by mouth daily. 12/02/21   Jake Shark, PA-C  buPROPion (WELLBUTRIN) 75 MG tablet Take 1 tablet (75 mg total) by mouth daily. 02/25/22   Jake Shark, PA-C       Allergies    Patient has no known allergies.    Review of Systems   Review of Systems  Genitourinary:  Positive for flank pain.  All other systems reviewed and are negative.   Physical Exam Updated Vital Signs BP (!) 187/106 (BP Location: Right Arm)   Pulse 84   Temp 97.7 F (36.5 C) (Oral)   Resp 20   Ht 5\' 5"  (1.651 m)   Wt 73 kg   SpO2 98%   BMI 26.79 kg/m  Physical Exam Vitals and nursing note reviewed.  Constitutional:      Appearance: She is well-developed.  HENT:     Head: Normocephalic.  Cardiovascular:     Rate and Rhythm: Normal rate.  Pulmonary:     Effort: Pulmonary effort is normal.  Abdominal:     General: Abdomen is flat. There is no distension.     Palpations: Abdomen is soft.  Musculoskeletal:        General: Normal range of motion.     Cervical back: Normal range of motion.  Skin:    General: Skin is warm.  Neurological:     General: No focal deficit present.     Mental Status: She is alert and oriented to person, place, and time.     ED Results / Procedures / Treatments   Labs (all labs ordered are listed, but only abnormal results are displayed) Labs Reviewed  URINALYSIS, ROUTINE  W REFLEX MICROSCOPIC - Abnormal; Notable for the following components:      Result Value   Hgb urine dipstick TRACE (*)    All other components within normal limits  COMPREHENSIVE METABOLIC PANEL - Abnormal; Notable for the following components:   Potassium 3.3 (*)    Glucose, Bld 147 (*)    All other components within normal limits  CBC WITH DIFFERENTIAL/PLATELET  LIPASE, BLOOD  URINALYSIS, MICROSCOPIC (REFLEX)    EKG None  Radiology CT Renal Stone Study  Result Date: 07/12/2022 CLINICAL DATA:  66 year old female with history of lower back pain for the past 2 hours. Flank pain. Urinary frequency. EXAM: CT ABDOMEN AND PELVIS WITHOUT CONTRAST TECHNIQUE: Multidetector CT imaging of the abdomen and pelvis was performed following the standard protocol  without IV contrast. RADIATION DOSE REDUCTION: This exam was performed according to the departmental dose-optimization program which includes automated exposure control, adjustment of the mA and/or kV according to patient size and/or use of iterative reconstruction technique. COMPARISON:  CT of the abdomen and pelvis 07/25/2011. FINDINGS: Lower chest: Scattered areas of mild scarring are noted in the lung bases bilaterally. Mild emphysema. Atherosclerotic calcifications are noted in the descending thoracic aorta. Hepatobiliary: No definite suspicious cystic or solid hepatic lesions are confidently identified on today's noncontrast CT examination. Unenhanced appearance of the gallbladder is normal. Pancreas: No definite pancreatic mass or peripancreatic fluid collections or inflammatory changes are noted on today's noncontrast CT examination. Spleen: Numerous small calcified granulomas scattered throughout the spleen. Adrenals/Urinary Tract: No calcifications are identified within either renal collecting system. However, at the level of the right ureterovesicular junction (axial image 70 of series 3) there is a 3 mm calculus. This is associated with moderate proximal right hydroureteronephrosis and extensive right-sided perinephric soft tissue stranding and fluid. Unenhanced appearance of the left kidney, bilateral adrenal glands and urinary bladder is otherwise unremarkable. Stomach/Bowel: Unenhanced appearance of the stomach is normal. No pathologic dilatation of small bowel or colon. Normal appendix. Vascular/Lymphatic: Atherosclerotic calcifications in the abdominal aorta and pelvic vasculature. No definite lymphadenopathy noted in the abdomen or pelvis. Reproductive: Unenhanced appearance of the uterus and ovaries is unremarkable. Other: No significant volume of ascites.  No pneumoperitoneum. Musculoskeletal: There are no aggressive appearing lytic or blastic lesions noted in the visualized portions of the  skeleton. IMPRESSION: 1. 3 mm obstructive calculus at the right ureterovesicular junction with moderate proximal right hydroureteronephrosis, extensive perinephric soft tissue stranding and perinephric right-sided fluid. 2. Aortic atherosclerosis. 3. Emphysema. 4. Additional incidental findings, as above. Electronically Signed   By: Vinnie Langton M.D.   On: 07/12/2022 06:00    Procedures Procedures    Medications Ordered in ED Medications - No data to display  ED Course/ Medical Decision Making/ A&P                           Medical Decision Making Patient complains of right-sided low back pain that began this a.m.  Amount and/or Complexity of Data Reviewed External Data Reviewed: notes.    Details: [facility] notes reviewed Labs: ordered. Decision-making details documented in ED Course.    Details: UA shows a trace of blood.  CBC and CHEM are normal Radiology: ordered and independent interpretation performed. Decision-making details documented in ED Course.    Details: 3 mm obstructive calculus of the right UVJ with moderate proximal right hydronephrosis  Risk Prescription drug management. Risk Details: Patient counseled on results.  Patient advised that she  has a 3 mm right UVJ stone.  Patient is not vomiting.  I discussed pain management and urology follow-up with patient patient is given a prescription for hydrocodone Zofran and Flomax.  Patient is advised to return to the emergency department if symptoms worsen or change           Final Clinical Impression(s) / ED Diagnoses Final diagnoses:  Kidney stone    Rx / DC Orders ED Discharge Orders          Ordered    HYDROcodone-acetaminophen (NORCO/VICODIN) 5-325 MG tablet  Every 4 hours PRN        07/12/22 0839    ondansetron (ZOFRAN-ODT) 4 MG disintegrating tablet  Every 8 hours PRN        07/12/22 0839    tamsulosin (FLOMAX) 0.4 MG CAPS capsule  Daily        07/12/22 0839           An After Visit Summary  was printed and given to the patient.    Elson Areas, [place] 07/12/22 1120    Glendora Score, MD 07/12/22 [phone]

## 2022-07-12 NOTE — Discharge Instructions (Addendum)
Schedule to see the Urologist for recheck  °

## 2022-07-12 NOTE — ED Triage Notes (Signed)
Pt c/o lower back pain x 2 hours, lower abd pain, emesis and urinary frequency

## 2022-07-12 NOTE — ED Provider Triage Note (Signed)
Emergency Medicine Provider Triage Evaluation Note  Nicole Joyce , a 66 y.o. female  was evaluated in triage.  Pt complains of right-sided flank pain that woke her from her sleep tonight with sensation of urinary frequency but passing small amounts of urine every time she goes.  No dysuria.  No blood in the urine, no history of kidney stones..  Review of Systems  Positive: As above, nausea with NBNB emesis Negative: Chest pain shortness of breath diarrhea Physical Exam  BP (!) 160/103 (BP Location: Right Arm)   Pulse 80   Temp 97.7 F (36.5 C) (Oral)   Resp 20   Ht 5\' 5"  (1.651 m)   Wt 73 kg   SpO2 96%   BMI 26.79 kg/m  Gen:   Awake, no distress   Resp:  Normal effort  MSK:   Moves extremities without difficulty  Other:  No CVA tenderness but some right upper quadrant tenderness palpation  Medical Decision Making  Medically screening exam initiated at 5:18 AM.  Appropriate orders placed.  Susa Bones was informed that the remainder of the evaluation will be completed by another provider, this initial triage assessment does not replace that evaluation, and the importance of remaining in the ED until their evaluation is complete.  This chart was dictated using voice recognition software, Dragon. Despite the best efforts of this provider to proofread and correct errors, errors may still occur which can change documentation meaning.    Emeline Darling, PA-C 07/12/22 [phone]

## 2022-07-13 ENCOUNTER — Telehealth: Payer: Self-pay

## 2022-07-13 NOTE — Telephone Encounter (Signed)
Transition Care Management Unsuccessful Follow-up Telephone Call  Date of discharge and from where:  [facility]  Attempts:  1st Attempt  Reason for unsuccessful TCM follow-up call:  Left voice message

## 2022-07-15 ENCOUNTER — Telehealth: Payer: Self-pay

## 2022-07-15 NOTE — Telephone Encounter (Signed)
Transition Care Management Unsuccessful Follow-up Telephone Call  Date of discharge and from where:  Nicole Joyce 07/12/22  Attempts:  2nd Attempt  Reason for unsuccessful TCM follow-up call:  Left voice message

## 2022-07-26 ENCOUNTER — Encounter: Payer: Self-pay | Admitting: Internal Medicine

## 2022-08-29 IMAGING — MG DIGITAL SCREENING BILAT W/ CAD
4 series · 4 of 4 positions shown · non-contrast
Comparison: Previous exam(s).

CLINICAL DATA: Screening.

EXAM:
DIGITAL SCREENING BILATERAL MAMMOGRAM WITH CAD
TECHNIQUE: Bilateral screening digital craniocaudal and mediolateral oblique
mammograms were obtained. The images were evaluated with
computer-aided detection.

[L MLO]
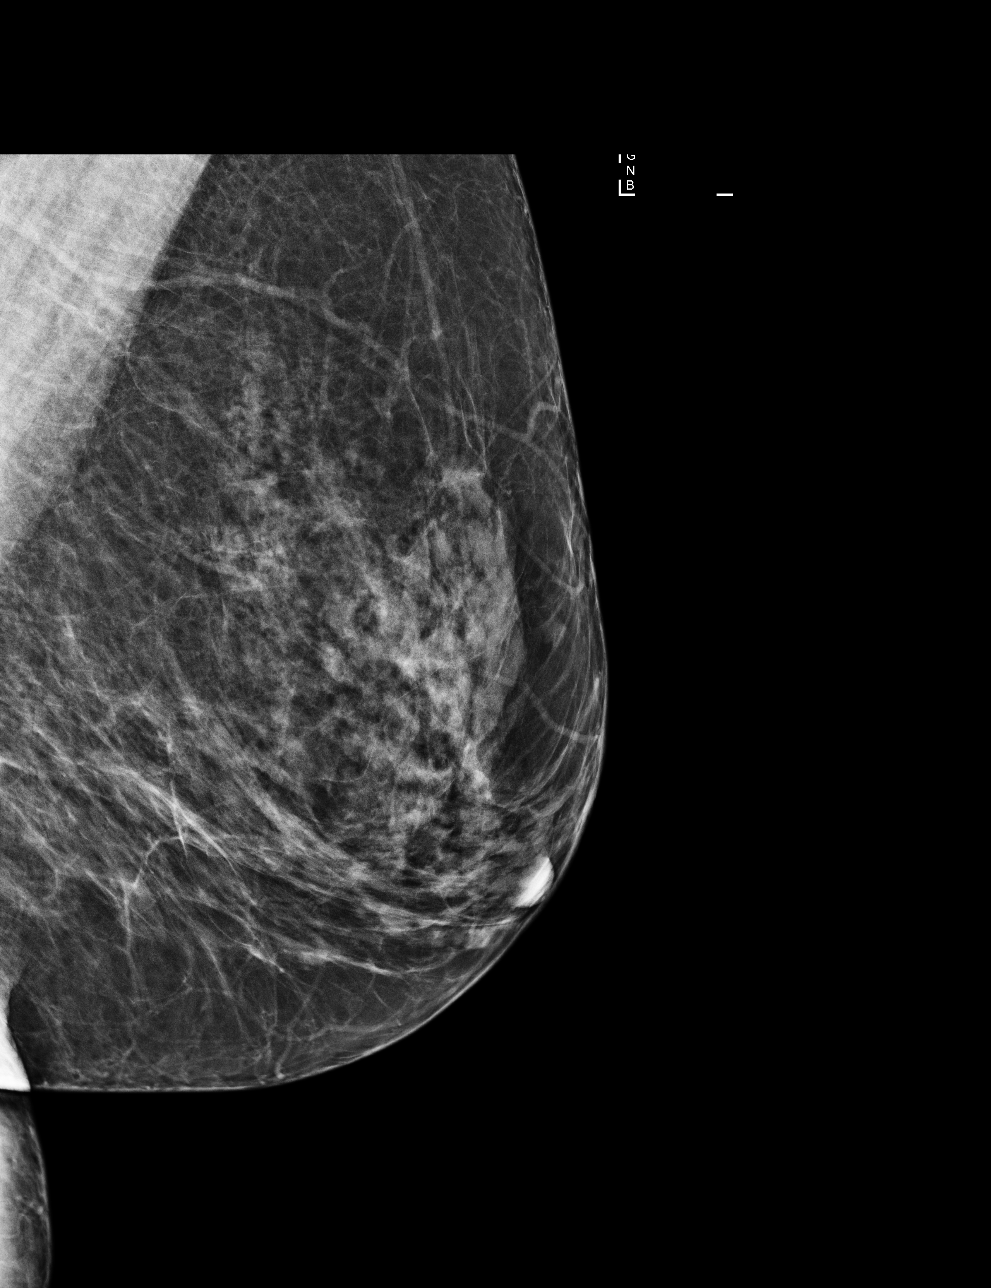

[R MLO]
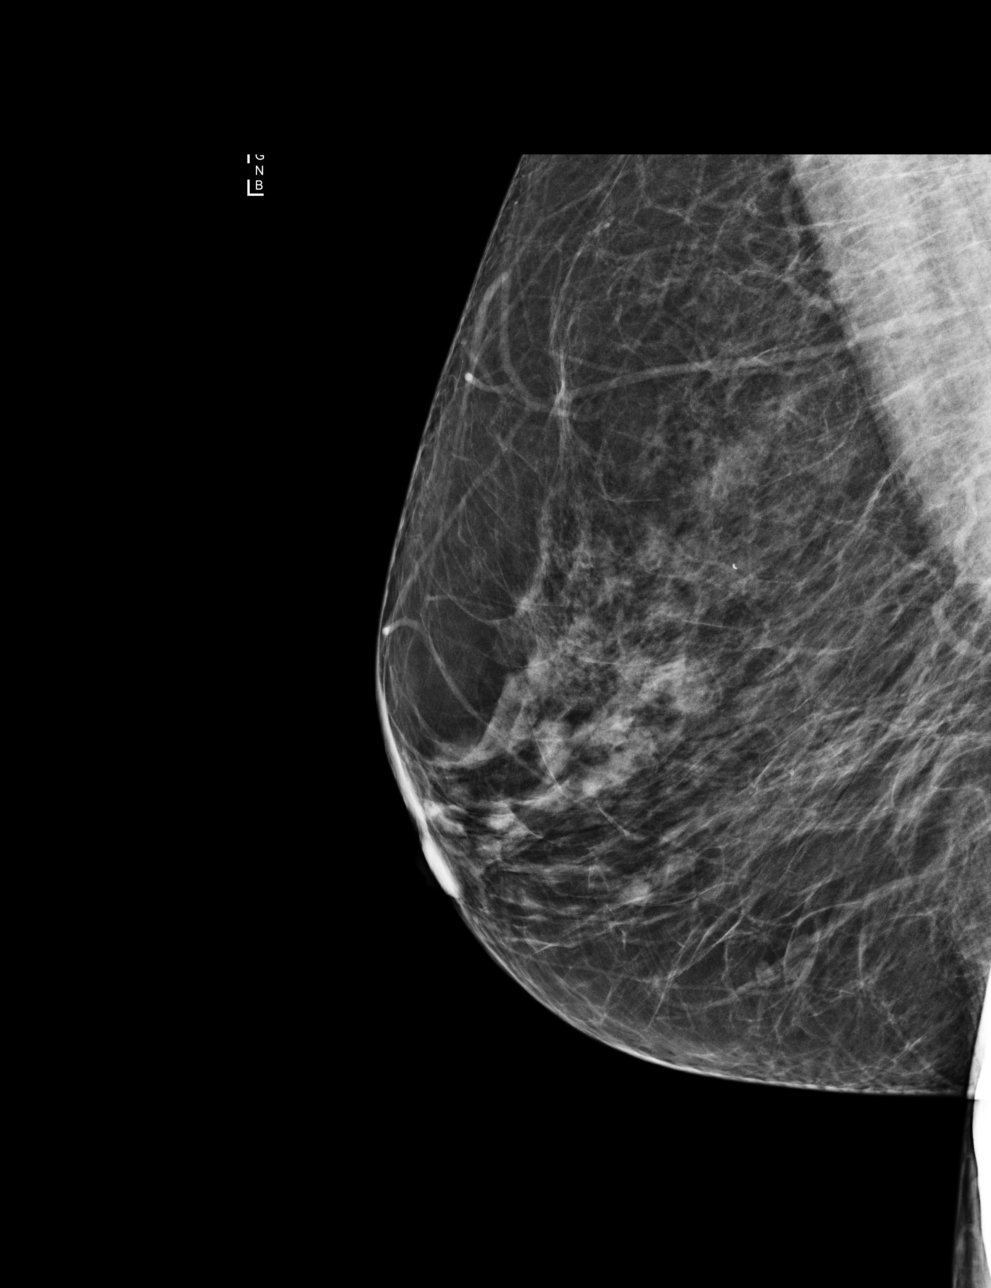

[R CC]
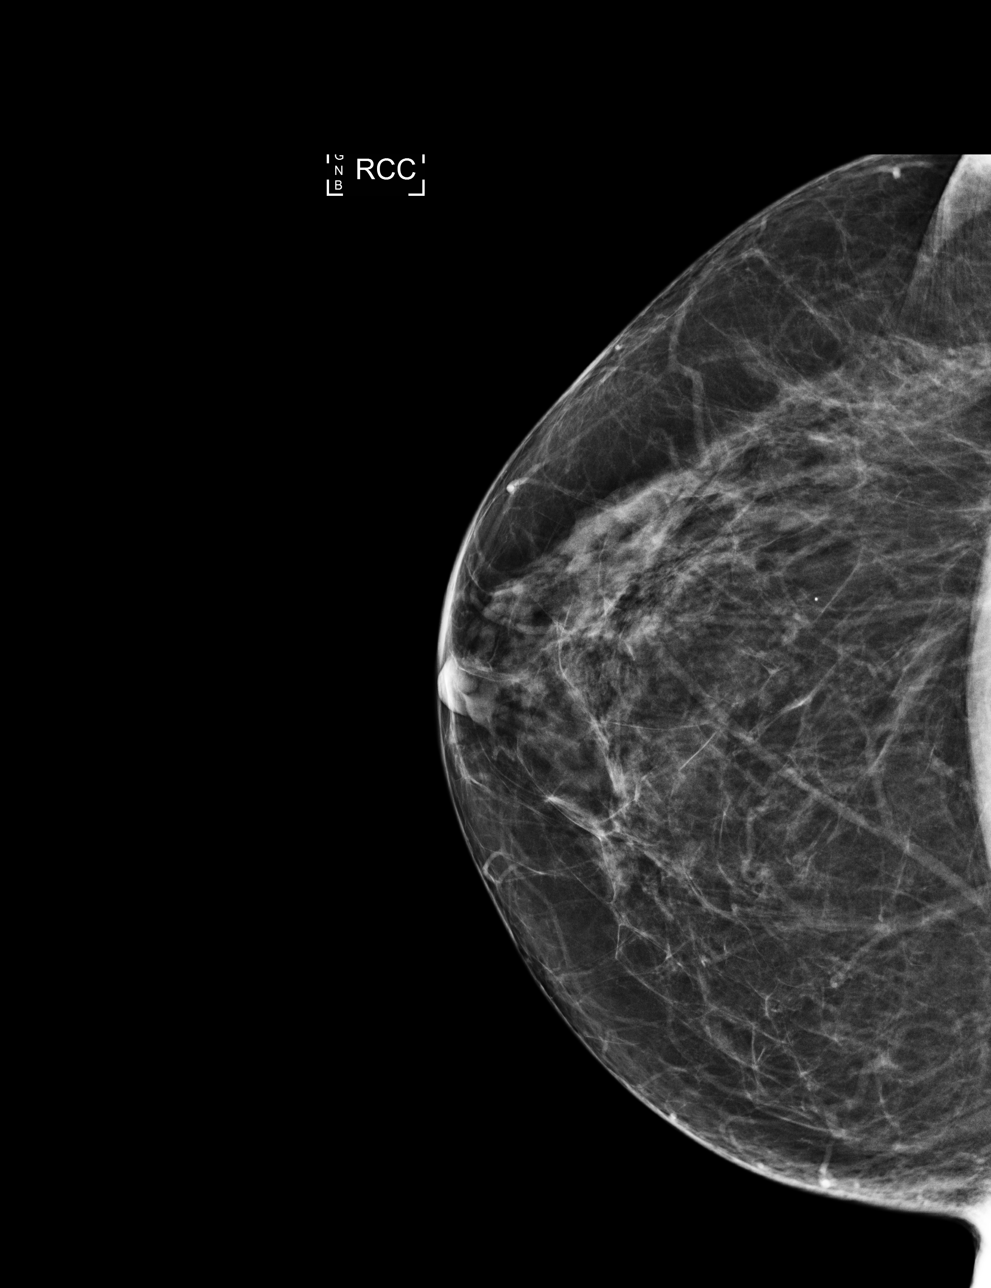

[L CC]
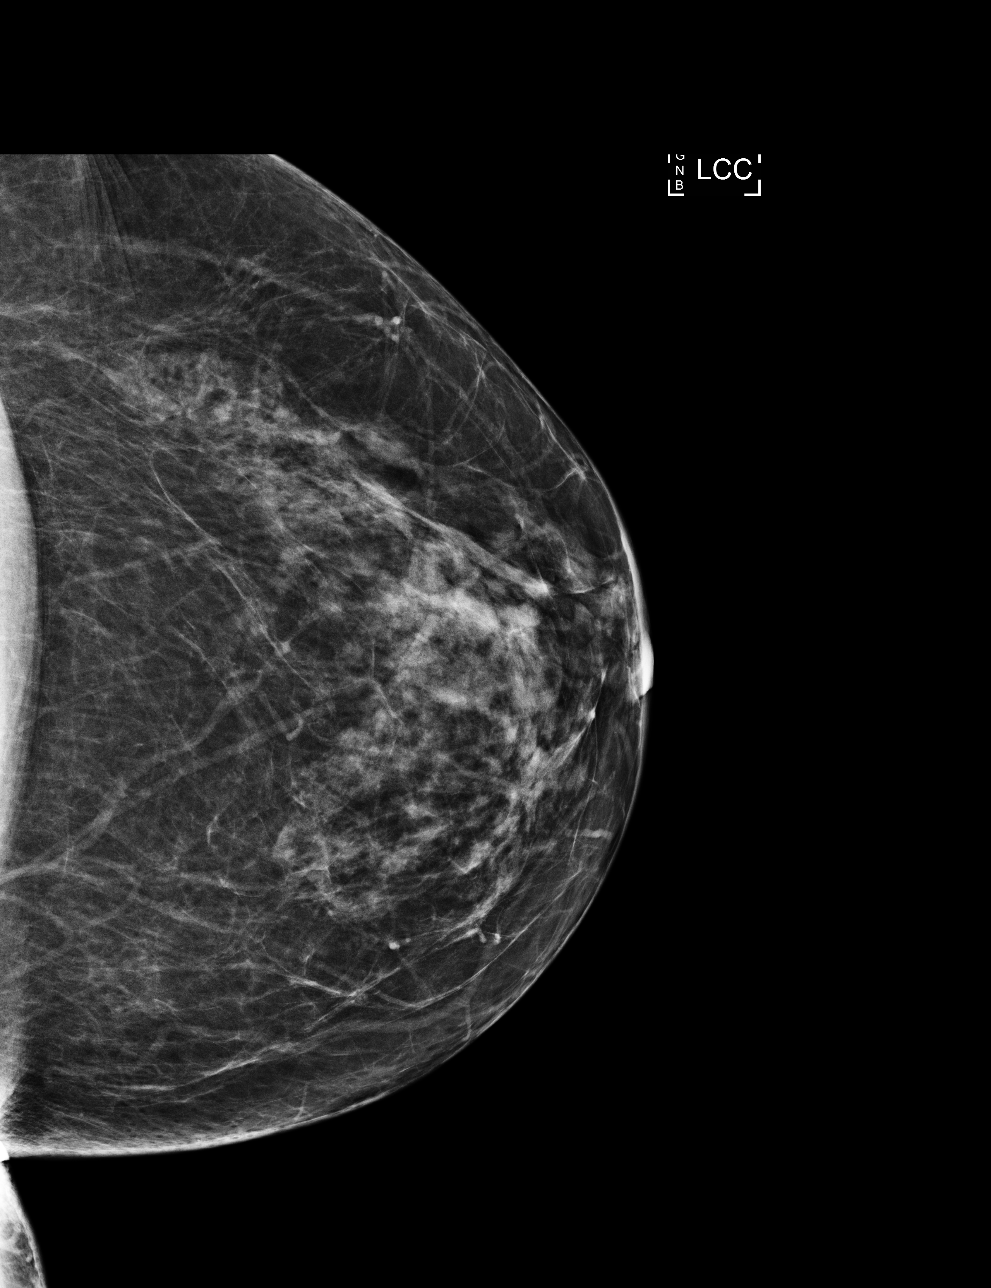

[4 of 4 positions shown; findings below may reference images not displayed]

ACR Breast Density Category c: The breast tissue is heterogeneously
dense, which may obscure small masses.
FINDINGS: There are no findings suspicious for malignancy. The images were
evaluated with computer-aided detection.
IMPRESSION: No mammographic evidence of malignancy. A result letter of this
screening mammogram will be mailed directly to the patient.

RECOMMENDATION:
Screening mammogram in one year. (Code:[id number]-W-Z4A)

BI-RADS CATEGORY  1: Negative.

## 2022-08-30 ENCOUNTER — Encounter: Payer: Self-pay | Admitting: Internal Medicine

## 2022-11-17 ENCOUNTER — Telehealth: Payer: Self-pay | Admitting: Physician Assistant

## 2022-11-17 NOTE — Telephone Encounter (Signed)
Left message for patient to call back and schedule Medicare Annual Wellness Visit (AWV) either virtually or in office. Left  my jabber number [phone]   Last AWV 12/02/21 please schedule with Nurse Health Adviser   45 min for awv-i  in office appointments 30 min for awv-s & awv-i phone/virtual appointments  

## 2022-12-05 ENCOUNTER — Encounter: Payer: Medicare HMO | Admitting: Physician Assistant

## 2022-12-14 ENCOUNTER — Telehealth: Payer: Self-pay | Admitting: Physician Assistant

## 2022-12-14 NOTE — Telephone Encounter (Signed)
Contacted Nicole Joyce to schedule their annual wellness visit. Appointment made for 12/27/22.  [name] AWV direct phone # [phone]

## 2022-12-14 NOTE — Telephone Encounter (Signed)
Called patient to schedule Medicare Annual Wellness Visit (AWV). Left message for patient to call back and schedule Medicare Annual Wellness Visit (AWV).  Last date of AWV: 12/02/21  Please schedule an appointment at any time with [facility].  If any questions, please contact me at [phone].  Thank you ,  [name] AWV direct phone # [phone]   Pt left message 2/27

## 2022-12-27 ENCOUNTER — Ambulatory Visit (INDEPENDENT_AMBULATORY_CARE_PROVIDER_SITE_OTHER): Payer: Medicare HMO

## 2022-12-27 VITALS — Ht 64.5 in | Wt 160.0 lb

## 2022-12-27 DIAGNOSIS — Z0000 Encounter for general adult medical examination without abnormal findings: Secondary | ICD-10-CM

## 2022-12-27 NOTE — Patient Instructions (Signed)
Nicole Joyce , Thank you for taking time to come for your Medicare Wellness Visit. I appreciate your ongoing commitment to your health goals. Please review the following plan we discussed and let me know if I can assist you in the future.   These are the goals we discussed:  Goals      Patient Stated     12/27/2022, quit smoking        This is a list of the screening recommended for you and due dates:  Health Maintenance  Topic Date Due   Zoster (Shingles) Vaccine (1 of 2) Never done   Screening for Lung Cancer  Never done   DEXA scan (bone density measurement)  Never done   COVID-19 Vaccine (4 - 2023-24 season) 06/17/2022   Mammogram  02/27/2023   Medicare Annual Wellness Visit  12/27/2023   Colon Cancer Screening  07/12/2025   DTaP/Tdap/Td vaccine (2 - Td or Tdap) 08/12/2031   Hepatitis C Screening: USPSTF Recommendation to screen - Ages 36-79 yo.  Completed   HPV Vaccine  Aged Out   Pneumonia Vaccine  Discontinued   Flu Shot  Discontinued    Advanced directives: copy in chart  Conditions/risks identified: smoking  Next appointment: Follow up in one year for your annual wellness visit    Preventive Care 68 Years and Older, Female Preventive care refers to lifestyle choices and visits with your health care provider that can promote health and wellness. What does preventive care include? A yearly physical exam. This is also called an annual well check. Dental exams once or twice a year. Routine eye exams. Ask your health care provider how often you should have your eyes checked. Personal lifestyle choices, including: Daily care of your teeth and gums. Regular physical activity. Eating a healthy diet. Avoiding tobacco and drug use. Limiting alcohol use. Practicing safe sex. Taking low-dose aspirin every day. Taking vitamin and mineral supplements as recommended by your health care provider. What happens during an annual well check? The services and screenings done by  your health care provider during your annual well check will depend on your age, overall health, lifestyle risk factors, and family history of disease. Counseling  Your health care provider may ask you questions about your: Alcohol use. Tobacco use. Drug use. Emotional well-being. Home and relationship well-being. Sexual activity. Eating habits. History of falls. Memory and ability to understand (cognition). Work and work Statistician. Reproductive health. Screening  You may have the following tests or measurements: Height, weight, and BMI. Blood pressure. Lipid and cholesterol levels. These may be checked every 5 years, or more frequently if you are over 44 years old. Skin check. Lung cancer screening. You may have this screening every year starting at age 44 if you have a 30-pack-year history of smoking and currently smoke or have quit within the past 15 years. Fecal occult blood test (FOBT) of the stool. You may have this test every year starting at age 66. Flexible sigmoidoscopy or colonoscopy. You may have a sigmoidoscopy every 5 years or a colonoscopy every 10 years starting at age 28. Hepatitis C blood test. Hepatitis B blood test. Sexually transmitted disease (STD) testing. Diabetes screening. This is done by checking your blood sugar (glucose) after you have not eaten for a while (fasting). You may have this done every 1-3 years. Bone density scan. This is done to screen for osteoporosis. You may have this done starting at age 56. Mammogram. This may be done every 1-2 years. Talk to  your health care provider about how often you should have regular mammograms. Talk with your health care provider about your test results, treatment options, and if necessary, the need for more tests. Vaccines  Your health care provider may recommend certain vaccines, such as: Influenza vaccine. This is recommended every year. Tetanus, diphtheria, and acellular pertussis (Tdap, Td) vaccine. You  may need a Td booster every 10 years. Zoster vaccine. You may need this after age 62. Pneumococcal 13-valent conjugate (PCV13) vaccine. One dose is recommended after age 86. Pneumococcal polysaccharide (PPSV23) vaccine. One dose is recommended after age 47. Talk to your health care provider about which screenings and vaccines you need and how often you need them. This information is not intended to replace advice given to you by your health care provider. Make sure you discuss any questions you have with your health care provider. Document Released: 10/30/2015 Document Revised: 06/22/2016 Document Reviewed: 08/04/2015 Elsevier Interactive Patient Education  2017 [place] Prevention in the Home Falls can cause injuries. They can happen to people of all ages. There are many things you can do to make your home safe and to help prevent falls. What can I do on the outside of my home? Regularly fix the edges of walkways and driveways and fix any cracks. Remove anything that might make you trip as you walk through a door, such as a raised step or threshold. Trim any bushes or trees on the path to your home. Use bright outdoor lighting. Clear any walking paths of anything that might make someone trip, such as rocks or tools. Regularly check to see if handrails are loose or broken. Make sure that both sides of any steps have handrails. Any raised decks and porches should have guardrails on the edges. Have any leaves, snow, or ice cleared regularly. Use sand or salt on walking paths during winter. Clean up any spills in your garage right away. This includes oil or grease spills. What can I do in the bathroom? Use night lights. Install grab bars by the toilet and in the tub and shower. Do not use towel bars as grab bars. Use non-skid mats or decals in the tub or shower. If you need to sit down in the shower, use a plastic, non-slip stool. Keep the floor dry. Clean up any water that spills  on the floor as soon as it happens. Remove soap buildup in the tub or shower regularly. Attach bath mats securely with double-sided non-slip rug tape. Do not have throw rugs and other things on the floor that can make you trip. What can I do in the bedroom? Use night lights. Make sure that you have a light by your bed that is easy to reach. Do not use any sheets or blankets that are too big for your bed. They should not hang down onto the floor. Have a firm chair that has side arms. You can use this for support while you get dressed. Do not have throw rugs and other things on the floor that can make you trip. What can I do in the kitchen? Clean up any spills right away. Avoid walking on wet floors. Keep items that you use a lot in easy-to-reach places. If you need to reach something above you, use a strong step stool that has a grab bar. Keep electrical cords out of the way. Do not use floor polish or wax that makes floors slippery. If you must use wax, use non-skid floor wax. Do not  have throw rugs and other things on the floor that can make you trip. What can I do with my stairs? Do not leave any items on the stairs. Make sure that there are handrails on both sides of the stairs and use them. Fix handrails that are broken or loose. Make sure that handrails are as long as the stairways. Check any carpeting to make sure that it is firmly attached to the stairs. Fix any carpet that is loose or worn. Avoid having throw rugs at the top or bottom of the stairs. If you do have throw rugs, attach them to the floor with carpet tape. Make sure that you have a light switch at the top of the stairs and the bottom of the stairs. If you do not have them, ask someone to add them for you. What else can I do to help prevent falls? Wear shoes that: Do not have high heels. Have rubber bottoms. Are comfortable and fit you well. Are closed at the toe. Do not wear sandals. If you use a stepladder: Make  sure that it is fully opened. Do not climb a closed stepladder. Make sure that both sides of the stepladder are locked into place. Ask someone to hold it for you, if possible. Clearly mark and make sure that you can see: Any grab bars or handrails. First and last steps. Where the edge of each step is. Use tools that help you move around (mobility aids) if they are needed. These include: Canes. Walkers. Scooters. Crutches. Turn on the lights when you go into a dark area. Replace any light bulbs as soon as they burn out. Set up your furniture so you have a clear path. Avoid moving your furniture around. If any of your floors are uneven, fix them. If there are any pets around you, be aware of where they are. Review your medicines with your doctor. Some medicines can make you feel dizzy. This can increase your chance of falling. Ask your doctor what other things that you can do to help prevent falls. This information is not intended to replace advice given to you by your health care provider. Make sure you discuss any questions you have with your health care provider. Document Released: 07/30/2009 Document Revised: 03/10/2016 Document Reviewed: 11/07/2014 Elsevier Interactive Patient Education  2017 [employer].

## 2022-12-27 NOTE — Progress Notes (Signed)
I connected with  Colbert Ewing on 12/27/22 by a audio enabled telemedicine application and verified that I am speaking with the correct person using two identifiers.  Patient Location: Home  Provider Location: Office/Clinic  I discussed the limitations of evaluation and management by telemedicine. The patient expressed understanding and agreed to proceed.   Subjective:   Nicole Joyce is a 67 y.o. female who presents for Medicare Annual (Subsequent) preventive examination.  Review of Systems     Cardiac Risk Factors include: advanced age (>64mn, >>31women);smoking/ tobacco exposure     Objective:    Today's Vitals   12/27/22 1156  Weight: 160 lb (72.6 kg)  Height: 5' 4.5" (1.638 m)   Body mass index is 27.04 kg/m.     12/27/2022   12:00 PM 07/12/2022    5:12 AM 12/02/2021    3:10 PM 03/10/2019   10:48 AM 08/24/2016   10:02 AM 02/04/2016    1:18 PM 01/05/2016    9:51 AM  Advanced Directives  Does Patient Have a Medical Advance Directive? Yes No No No No No No  Type of Advance Directive Out of facility DNR (pink MOST or yellow form)        Would patient like information on creating a medical advance directive?  No - Patient declined No - Patient declined Yes (ED - Information included in AVS) No - patient declined information No - patient declined information     Current Medications (verified) Outpatient Encounter Medications as of 12/27/2022  Medication Sig   albuterol (VENTOLIN HFA) 108 (90 Base) MCG/ACT inhaler Inhale 2 puffs into the lungs every 6 (six) hours as needed for wheezing or shortness of breath.   atorvastatin (LIPITOR) 10 MG tablet Take 1 tablet (10 mg total) by mouth daily.   buPROPion (WELLBUTRIN) 75 MG tablet Take 1 tablet (75 mg total) by mouth daily. (Patient not taking: Reported on 12/27/2022)   HYDROcodone-acetaminophen (NORCO/VICODIN) 5-325 MG tablet Take 1 tablet by mouth every 4 (four) hours as needed for moderate pain. (Patient not taking: Reported on  12/27/2022)   ondansetron (ZOFRAN-ODT) 4 MG disintegrating tablet Take 1 tablet (4 mg total) by mouth every 8 (eight) hours as needed for nausea or vomiting. (Patient not taking: Reported on 12/27/2022)   tamsulosin (FLOMAX) 0.4 MG CAPS capsule Take 1 capsule (0.4 mg total) by mouth daily. (Patient not taking: Reported on 12/27/2022)   No facility-administered encounter medications on file as of 12/27/2022.    Allergies (verified) Patient has no known allergies.   History: Past Medical History:  Diagnosis Date   Anemia    Asthma    Dysmenorrhea    Elevated lipids    Endometriosis    Hormone disorder    Infertility, female    PID (pelvic inflammatory disease)    Past Surgical History:  Procedure Laterality Date   ABDOMINAL SURGERY     exploratory laparotomy with treatment of endometriosis   PELVIC LAPAROSCOPY     ROTATOR CUFF REPAIR     Family History  Problem Relation Age of Onset   COPD Mother    Throat cancer Father    Hypertension Sister    COPD Sister    Hypertension Brother    COPD Brother    Lung cancer Paternal Grandfather    Hypertension Sister    Hypertension Sister    Social History   Socioeconomic History   Marital status: Married    Spouse name: Not on file   Number of children: Not on  file   Years of education: Not on file   Highest education level: Not on file  Occupational History   Not on file  Tobacco Use   Smoking status: Every Day    Packs/day: 0.50    Years: 50.00    Total pack years: 25.00    Types: Cigarettes   Smokeless tobacco: Never   Tobacco comments:    Cut back dramatically this week  Vaping Use   Vaping Use: Never used  Substance and Sexual Activity   Alcohol use: Not Currently   Drug use: Yes    Types: Marijuana   Sexual activity: Not Currently    Birth control/protection: None  Other Topics Concern   Not on file  Social History Narrative   Not on file   Social Determinants of Health   Financial Resource Strain: Low  Risk  (12/27/2022)   Overall Financial Resource Strain (CARDIA)    Difficulty of Paying Living Expenses: Not hard at all  Food Insecurity: No Food Insecurity (12/27/2022)   Hunger Vital Sign    Worried About Running Out of Food in the Last Year: Never true    Ran Out of Food in the Last Year: Never true  Transportation Needs: No Transportation Needs (12/27/2022)   PRAPARE - Hydrologist (Medical): No    Lack of Transportation (Non-Medical): No  Physical Activity: Insufficiently Active (12/27/2022)   Exercise Vital Sign    Days of Exercise per Week: 2 days    Minutes of Exercise per Session: 60 min  Stress: No Stress Concern Present (12/27/2022)   [place]    Feeling of Stress : Not at all  Social Connections: Not on file    Tobacco Counseling Ready to quit: Yes Counseling given: Not Answered Tobacco comments: Cut back dramatically this week   Clinical Intake:  Pre-visit preparation completed: Yes  Pain : No/denies pain     Nutritional Status: BMI 25 -29 Overweight Nutritional Risks: None Diabetes: No  How often do you need to have someone help you when you read instructions, pamphlets, or other written materials from your doctor or pharmacy?: 1 - Never  Diabetic? no  Interpreter Needed?: No  Information entered by :: NAllen LPN   Activities of Daily Living    12/27/2022   12:03 PM  In your present state of health, do you have any difficulty performing the following activities:  Hearing? 0  Vision? 0  Difficulty concentrating or making decisions? 0  Walking or climbing stairs? 0  Dressing or bathing? 0  Doing errands, shopping? 0  Preparing Food and eating ? N  Using the Toilet? N  In the past six months, have you accidently leaked urine? N  Do you have problems with loss of bowel control? N  Managing your Medications? N  Managing your Finances? N  Housekeeping or  managing your Housekeeping? N    Patient Care Team: Marcellina Millin (Inactive) as PCP - General (Physician Assistant)  Indicate any recent Medical Services you may have received from other than [facility] providers in the past year (date may be approximate).     Assessment:   This is a routine wellness examination for [place].  Hearing/Vision screen Vision Screening - Comments:: Regular eye exams, [facility]  Dietary issues and exercise activities discussed: Current Exercise Habits: Home exercise routine, Type of exercise: walking;calisthenics, Time (Minutes): 60, Frequency (Times/Week): 2, Weekly Exercise (Minutes/Week): 120  Goals Addressed             This Visit's Progress    Patient Stated       12/27/2022, quit smoking       Depression Screen    12/27/2022   12:02 PM 12/02/2021    3:04 PM 11/18/2020    2:30 PM  PHQ 2/9 Scores  PHQ - 2 Score 0 0 0    Fall Risk    12/27/2022   12:01 PM 12/02/2021    3:03 PM 11/18/2020    2:30 PM  [place] in the past year? '1 1 1  '$ Comment leg gave out    Number falls in past yr: 1 1 0  Comment  3 times   Injury with Fall? 0 0 0  Risk for fall due to : History of fall(s);Medication side effect Other (Comment)   Follow up Falls prevention discussed;Education provided;Falls evaluation completed Falls evaluation completed     FALL RISK PREVENTION PERTAINING TO THE HOME:  Any stairs in or around the home? Yes  If so, are there any without handrails? No  Home free of loose throw rugs in walkways, pet beds, electrical cords, etc? Yes  Adequate lighting in your home to reduce risk of falls? Yes   ASSISTIVE DEVICES UTILIZED TO PREVENT FALLS:  Life alert? No  Use of a cane, walker or w/c? No  Grab bars in the bathroom? No  Shower chair or bench in shower? No  Elevated toilet seat or a handicapped toilet? No   TIMED UP AND GO:  Was the test performed? No .      Cognitive Function:        12/27/2022   12:05  PM  6CIT Screen  What Year? 0 points  What month? 0 points  What time? 0 points  Count back from 20 0 points  Months in reverse 0 points  Repeat phrase 10 points  Total Score 10 points    Immunizations Immunization History  Administered Date(s) Administered   PFIZER(Purple Top)SARS-COV-2 Vaccination 12/14/2019, 01/04/2020   Pfizer Covid-19 Vaccine Bivalent Booster 67yr & up 12/03/2021   Tdap 08/11/2021    TDAP status: Up to date  Flu Vaccine status: Declined, Education has been provided regarding the importance of this vaccine but patient still declined. Advised may receive this vaccine at local pharmacy or Health Dept. Aware to provide a copy of the vaccination record if obtained from local pharmacy or Health Dept. Verbalized acceptance and understanding.  Pneumococcal vaccine status: Declined,  Education has been provided regarding the importance of this vaccine but patient still declined. Advised may receive this vaccine at local pharmacy or Health Dept. Aware to provide a copy of the vaccination record if obtained from local pharmacy or Health Dept. Verbalized acceptance and understanding.   Covid-19 vaccine status: Completed vaccines  Qualifies for Shingles Vaccine? Yes   Zostavax completed No   Shingrix Completed?: No.    Education has been provided regarding the importance of this vaccine. Patient has been advised to call insurance company to determine out of pocket expense if they have not yet received this vaccine. Advised may also receive vaccine at local pharmacy or Health Dept. Verbalized acceptance and understanding.  Screening Tests Health Maintenance  Topic Date Due   Zoster Vaccines- Shingrix (1 of 2) Never done   Lung Cancer Screening  Never done   DEXA SCAN  Never done   COVID-19 Vaccine (4 - 2023-24 season) 06/17/2022  Medicare Annual Wellness (AWV)  02/12/2023   MAMMOGRAM  02/27/2023   COLONOSCOPY (Pts 45-1yr Insurance coverage will need to be  confirmed)  07/12/2025   DTaP/Tdap/Td (2 - Td or Tdap) 08/12/2031   Hepatitis C Screening  Completed   HPV VACCINES  Aged Out   Pneumonia Vaccine 68 Years old  Discontinued   [place]  Health Maintenance Due  Topic Date Due   Zoster Vaccines- Shingrix (1 of 2) Never done   Lung Cancer Screening  Never done   DEXA SCAN  Never done   COVID-19 Vaccine (4 - 2023-24 season) 06/17/2022   Medicare Annual Wellness (AWV)  02/12/2023    Colorectal cancer screening: Type of screening: Colonoscopy. Completed 07/13/2015. Repeat every 10 years  Mammogram status: Completed 2023. Repeat every year  Bone Density status: due  Lung Cancer Screening: (Low Dose CT Chest recommended if Age 68-80years, 30 pack-year currently smoking OR have quit w/in 15years.) does not qualify.   Lung Cancer Screening Referral: no  Additional Screening:  Hepatitis C Screening: does qualify; Completed 11/18/2020  Vision Screening: Recommended annual ophthalmology exams for early detection of glaucoma and other disorders of the eye. Is the patient up to date with their annual eye exam?  Yes  Who is the provider or what is the name of the office in which the patient attends annual eye exams[facility] is not established with a provider, would they like to be referred to a provider to establish care? No .   Dental Screening: Recommended annual dental exams for proper oral hygiene  Community Resource Referral / Chronic Care Management: CRR required this visit?  No   CCM required this visit?  No      Plan:     I have personally reviewed and noted the following in the patient's chart:   Medical and social history Use of alcohol, tobacco or illicit drugs  Current medications and supplements including opioid prescriptions. Patient is not currently taking opioid prescriptions. Functional ability and status Nutritional status Physical activity Advanced  directives List of other physicians Hospitalizations, surgeries, and ER visits in previous 12 months Vitals Screenings to include cognitive, depression, and falls Referrals and appointments  In addition, I have reviewed and discussed with patient certain preventive protocols, quality metrics, and best practice recommendations. A written personalized care plan for preventive services as well as general preventive health recommendations were provided to patient.     NKellie Simmering LPN   [id number]  Nurse Notes: none  Due to this being a virtual visit, the after visit summary with patients personalized plan was offered to patient via mail or my-chart. Patient would like to access on my-chart

## 2022-12-29 ENCOUNTER — Other Ambulatory Visit: Payer: Self-pay | Admitting: Physician Assistant

## 2022-12-29 DIAGNOSIS — Z9189 Other specified personal risk factors, not elsewhere classified: Secondary | ICD-10-CM

## 2023-01-26 ENCOUNTER — Encounter: Payer: Self-pay | Admitting: Nurse Practitioner

## 2023-01-26 ENCOUNTER — Ambulatory Visit (INDEPENDENT_AMBULATORY_CARE_PROVIDER_SITE_OTHER): Payer: Medicare HMO | Admitting: Nurse Practitioner

## 2023-01-26 VITALS — BP 130/82 | HR 78 | Ht 64.75 in | Wt 163.2 lb

## 2023-01-26 DIAGNOSIS — R252 Cramp and spasm: Secondary | ICD-10-CM | POA: Insufficient documentation

## 2023-01-26 DIAGNOSIS — Z1231 Encounter for screening mammogram for malignant neoplasm of breast: Secondary | ICD-10-CM

## 2023-01-26 DIAGNOSIS — Z13820 Encounter for screening for osteoporosis: Secondary | ICD-10-CM | POA: Diagnosis not present

## 2023-01-26 DIAGNOSIS — Z0000 Encounter for general adult medical examination without abnormal findings: Secondary | ICD-10-CM | POA: Diagnosis not present

## 2023-01-26 DIAGNOSIS — Z8619 Personal history of other infectious and parasitic diseases: Secondary | ICD-10-CM

## 2023-01-26 DIAGNOSIS — E559 Vitamin D deficiency, unspecified: Secondary | ICD-10-CM

## 2023-01-26 DIAGNOSIS — F17200 Nicotine dependence, unspecified, uncomplicated: Secondary | ICD-10-CM | POA: Diagnosis not present

## 2023-01-26 DIAGNOSIS — Z23 Encounter for immunization: Secondary | ICD-10-CM

## 2023-01-26 DIAGNOSIS — E785 Hyperlipidemia, unspecified: Secondary | ICD-10-CM

## 2023-01-26 HISTORY — DX: Cramp and spasm: R25.2

## 2023-01-26 MED ORDER — ZOSTER VAC RECOMB ADJUVANTED 50 MCG/0.5ML IM SUSR
0.5000 mL | Freq: Once | INTRAMUSCULAR | 1 refills | Status: AC
Start: 2023-01-26 — End: 2023-01-26

## 2023-01-26 NOTE — Patient Instructions (Addendum)
It was a pleasure to meet you today!   I have provided you with a vaccine for Shingles. You can have this completed at the pharmacy at your convenience.   I have ordered your mammogram and bone density scan. This is completed at the same place so when they call you to schedule, ask them to schedule both of them at the same time.   I have ordered the lung cancer screening for you. They will call the schedule this.

## 2023-01-26 NOTE — Progress Notes (Signed)
Shawna Clamp, DNP, AGNP-c [facility] [address] [place], [place] [address] Main Office [phone]  BP 130/82   Pulse 78   Ht 5' 4.75" (1.645 m)   Wt 163 lb 3.2 oz (74 kg)   BMI 27.37 kg/m    Subjective:    Patient ID: Nicole Joyce, female    DOB: [birth year], 67 y.o.   MRN: [id number]  HPI: Nicole Joyce is a 67 y.o. female presenting on 01/26/2023 for comprehensive medical examination.  Nicole Joyce presents today with chief complaints of bone pain and aching, which she attributes to her arthritis. She also reports experiencing spasms in her leg muscles and toes, describing the sensation as painful. Nicole Joyce notes that she has been advised to undergo a bone density test but has been unable to do so due to the COVID-19 pandemic. Additionally, she admits to not consuming an adequate amount of water.  The patient has been taking vitamin D supplements at a dosage of 600 milligrams for approximately 15 years to support her bone health. She mentions past issues with bowel regularity but reports that incorporating rice into her diet has been beneficial. Nicole Joyce is scheduled for a mammogram in May and is considering the possibility of having a DEXA scan for bone density concurrently.  Nicole Joyce has a personal history of shingles, which she reports was not severe. She is informed about the shingles vaccine and its recommendation for individuals aged 28 and over. With a family history of lung cancer, she is contemplating undergoing a low-dose CT scan for lung cancer screening.  As a current smoker, Nicole Joyce is actively trying to quit. She has successfully reduced her smoking frequency from a pack every two to two and a half days to a pack every seven days. She refrains from smoking between 7 a.m. to 3 or 4 p.m. and has set a goal to quit smoking entirely by January.  Regarding respiratory issues, the patient uses an inhaler approximately twice a year, mainly between September and  December or around the current period. She reports having used the inhaler once last week and once this week due to a recent cold. While Nicole Joyce does not take allergy medication on a daily basis, she has observed that her allergies have worsened this year compared to the previous year.  IMMUNIZATIONS:   Flu: Flu vaccine completed elsewhere this season Prevnar 13: Prevnar 13 N/A for this patient Prevnar 20: Prevnar 20 N/A for this patient Pneumovax 23: Pneumovax 23 N/A for this patient Vac Shingrix: Shingrix due, prescription provided HPV: HPV N/A for this patient Tetanus: Tetanus completed in the last 10 years  HEALTH MAINTENANCE: Pap Smear HM Status: is up to date Mammogram HM Status: ordered today Colon Cancer Screening HM Status: is up to date Bone Density HM Status: ordered today STI Testing HM Status: is not applicable for this patient  She reports regular vision exams q1-5y: Yes  She reports regular dental exams q 4m:  Yes  The patient eats a regular, healthy diet. She endorses exercise and/or activity of: active at work  She currently: Marital Status: married Living situation: with spouse Occupation: Manufacturing systems engineer  Pertinent items are noted in HPI.  Most Recent Depression Screen:     01/26/2023    1:59 PM 12/27/2022   12:02 PM 12/02/2021    3:04 PM 11/18/2020    2:30 PM  Depression screen PHQ 2/9  Decreased Interest 0 0 0 0  Down, Depressed, Hopeless 0 0 0 0  PHQ - 2 Score  0 0 0 0   Most Recent Anxiety Screen:      No data to display         Most Recent Fall Screen:    01/26/2023    1:59 PM 12/27/2022   12:01 PM 12/02/2021    3:03 PM 11/18/2020    2:30 PM  Fall Risk   Falls in the past year? 0 1 1 1   Comment  leg gave out    Number falls in past yr: 0 1 1 0  Comment   3 times   Injury with Fall? 0 0 0 0  Risk for fall due to : No Fall Risks History of fall(s);Medication side effect Other (Comment)   Follow up Falls evaluation completed Falls prevention  discussed;Education provided;Falls evaluation completed Falls evaluation completed     Past medical history, surgical history, medications, allergies, family history and social history reviewed with patient today and changes made to appropriate areas of the chart.  Past Medical History:  Past Medical History:  Diagnosis Date   Abnormal lung sounds 11/18/2020   Anemia    Asthma    Bilateral arm pain 02/03/2020   Dysmenorrhea    Elevated lipids    Endometriosis    Estrogen deficiency 11/18/2020   Fall 01/05/2016   Hormone disorder    Infertility, female    Need for COVID-19 vaccine 12/02/2021   PID (pelvic inflammatory disease)    Medications:  Current Outpatient Medications on File Prior to Visit  Medication Sig   albuterol (VENTOLIN HFA) 108 (90 Base) MCG/ACT inhaler Inhale 2 puffs into the lungs every 6 (six) hours as needed for wheezing or shortness of breath.   atorvastatin (LIPITOR) 10 MG tablet TAKE 1 TABLET BY MOUTH EVERY DAY   buPROPion (WELLBUTRIN) 75 MG tablet Take 1 tablet (75 mg total) by mouth daily. (Patient not taking: Reported on 12/27/2022)   HYDROcodone-acetaminophen (NORCO/VICODIN) 5-325 MG tablet Take 1 tablet by mouth every 4 (four) hours as needed for moderate pain. (Patient not taking: Reported on 12/27/2022)   ondansetron (ZOFRAN-ODT) 4 MG disintegrating tablet Take 1 tablet (4 mg total) by mouth every 8 (eight) hours as needed for nausea or vomiting. (Patient not taking: Reported on 12/27/2022)   tamsulosin (FLOMAX) 0.4 MG CAPS capsule Take 1 capsule (0.4 mg total) by mouth daily. (Patient not taking: Reported on 12/27/2022)   No current facility-administered medications on file prior to visit.   Surgical History:  Past Surgical History:  Procedure Laterality Date   ABDOMINAL SURGERY     exploratory laparotomy with treatment of endometriosis   PELVIC LAPAROSCOPY     ROTATOR CUFF REPAIR     Allergies:  No Known Allergies Family History:  Family History   Problem Relation Age of Onset   COPD Mother    Throat cancer Father    Hypertension Sister    COPD Sister    Hypertension Brother    COPD Brother    Lung cancer Paternal Grandfather    Hypertension Sister    Hypertension Sister        Objective:    BP 130/82   Pulse 78   Ht 5' 4.75" (1.645 m)   Wt 163 lb 3.2 oz (74 kg)   BMI 27.37 kg/m   Wt Readings from Last 3 Encounters:  01/26/23 163 lb 3.2 oz (74 kg)  12/27/22 160 lb (72.6 kg)  07/12/22 161 lb (73 kg)    Physical Exam Vitals and nursing note reviewed.  Constitutional:  General: She is not in acute distress.    Appearance: Normal appearance.  HENT:     Head: Normocephalic and atraumatic.     Right Ear: Hearing, tympanic membrane, ear canal and external ear normal.     Left Ear: Hearing, tympanic membrane, ear canal and external ear normal.     Nose: Nose normal.     Right Sinus: No maxillary sinus tenderness or frontal sinus tenderness.     Left Sinus: No maxillary sinus tenderness or frontal sinus tenderness.     Mouth/Throat:     Lips: Pink.     Mouth: Mucous membranes are moist.     Pharynx: Oropharynx is clear.  Eyes:     General: Lids are normal. Vision grossly intact.     Extraocular Movements: Extraocular movements intact.     Conjunctiva/sclera: Conjunctivae normal.     Pupils: Pupils are equal, round, and reactive to light.     Funduscopic exam:    Right eye: Red reflex present.        Left eye: Red reflex present.    Visual Fields: Right eye visual fields normal and left eye visual fields normal.  Neck:     Thyroid: No thyromegaly.     Vascular: No carotid bruit.  Cardiovascular:     Rate and Rhythm: Normal rate and regular rhythm.     Chest Wall: PMI is not displaced.     Pulses: Normal pulses.          Dorsalis pedis pulses are 2+ on the right side and 2+ on the left side.       Posterior tibial pulses are 2+ on the right side and 2+ on the left side.     Heart sounds: Normal heart  sounds. No murmur heard. Pulmonary:     Effort: Pulmonary effort is normal. No respiratory distress.     Breath sounds: Normal breath sounds.  Abdominal:     General: Abdomen is flat. Bowel sounds are normal. There is no distension.     Palpations: Abdomen is soft. There is no hepatomegaly, splenomegaly or mass.     Tenderness: There is no abdominal tenderness. There is no right CVA tenderness, left CVA tenderness, guarding or rebound.  Musculoskeletal:        General: Normal range of motion.     Cervical back: Full passive range of motion without pain, normal range of motion and neck supple. No tenderness.     Right lower leg: No edema.     Left lower leg: No edema.  Feet:     Left foot:     Toenail Condition: Left toenails are normal.  Lymphadenopathy:     Cervical: No cervical adenopathy.     Upper Body:     Right upper body: No supraclavicular adenopathy.     Left upper body: No supraclavicular adenopathy.  Skin:    General: Skin is warm and dry.     Capillary Refill: Capillary refill takes less than 2 seconds.     Nails: There is no clubbing.  Neurological:     General: No focal deficit present.     Mental Status: She is alert and oriented to person, place, and time.     GCS: GCS eye subscore is 4. GCS verbal subscore is 5. GCS motor subscore is 6.     Sensory: Sensation is intact.     Motor: Motor function is intact.     Coordination: Coordination is intact.  Gait: Gait is intact.     Deep Tendon Reflexes: Reflexes are normal and symmetric.  Psychiatric:        Attention and Perception: Attention normal.        Mood and Affect: Mood normal.        Speech: Speech normal.        Behavior: Behavior normal. Behavior is cooperative.        Cognition and Memory: Cognition and memory normal.     Results for orders placed or performed in visit on 01/26/23  CBC with Differential/Platelet  Result Value Ref Range   WBC 5.5 3.4 - 10.8 x10E3/uL   RBC 4.56 3.77 - 5.28  x10E6/uL   Hemoglobin 13.2 11.1 - 15.9 g/dL   Hematocrit 16.140.6 09.634.0 - 46.6 %   MCV 89 79 - 97 fL   MCH 28.9 26.6 - 33.0 pg   MCHC 32.5 31.5 - 35.7 g/dL   RDW 04.513.4 40.911.7 - 81.115.4 %   Platelets 328 150 - 450 x10E3/uL   Neutrophils 28 Not Estab. %   Lymphs 63 Not Estab. %   Monocytes 6 Not Estab. %   Eos 2 Not Estab. %   Basos 1 Not Estab. %   Neutrophils Absolute 1.6 1.4 - 7.0 x10E3/uL   Lymphocytes Absolute 3.5 (H) 0.7 - 3.1 x10E3/uL   Monocytes Absolute 0.3 0.1 - 0.9 x10E3/uL   EOS (ABSOLUTE) 0.1 0.0 - 0.4 x10E3/uL   Basophils Absolute 0.1 0.0 - 0.2 x10E3/uL   Immature Granulocytes 0 Not Estab. %   Immature Grans (Abs) 0.0 0.0 - 0.1 x10E3/uL  Comprehensive metabolic panel  Result Value Ref Range   Glucose 89 70 - 99 mg/dL   BUN 12 8 - 27 mg/dL   Creatinine, Ser 9.140.78 0.57 - 1.00 mg/dL   eGFR 83 >78>59 GN/FAO/1.30mL/min/1.73   BUN/Creatinine Ratio 15 12 - 28   Sodium 141 134 - 144 mmol/L   Potassium 4.1 3.5 - 5.2 mmol/L   Chloride 103 96 - 106 mmol/L   CO2 24 20 - 29 mmol/L   Calcium 10.0 8.7 - 10.3 mg/dL   Total Protein 7.2 6.0 - 8.5 g/dL   Albumin 4.7 3.9 - 4.9 g/dL   Globulin, Total 2.5 1.5 - 4.5 g/dL   Albumin/Globulin Ratio 1.9 1.2 - 2.2   Bilirubin Total 0.9 0.0 - 1.2 mg/dL   Alkaline Phosphatase 90 44 - 121 IU/L   AST 17 0 - 40 IU/L   ALT 15 0 - 32 IU/L  Lipid panel  Result Value Ref Range   Cholesterol, Total 165 100 - 199 mg/dL   Triglycerides 80 0 - 149 mg/dL   HDL 91 >86>39 mg/dL   VLDL Cholesterol Cal 15 5 - 40 mg/dL   LDL Chol Calc (NIH) 59 0 - 99 mg/dL   Chol/HDL Ratio 1.8 0.0 - 4.4 ratio  VITAMIN D 25 Hydroxy (Vit-D Deficiency, Fractures)  Result Value Ref Range   Vit D, 25-Hydroxy 14.3 (L) 30.0 - 100.0 ng/mL  Magnesium  Result Value Ref Range   Magnesium 2.2 1.6 - 2.3 mg/dL         Assessment & Plan:   Problem List Items Addressed This Visit     Smoker    Current every day smoker working on quitting. Patient praised for her efforts. She is in agreement to low  dose lung CT for screening today.  Plan: - I have placed the order for a lung cancer screening. Someone will call you to  set this up.       Relevant Orders   Ambulatory Referral for Lung Cancer Scre   History of shingles    Discussion and recommendation for shingles vaccine today. Prescription printed and provided to the patient to take to the pharmacy to have completed.       Nocturnal muscle cramps    Suspect that dehydration may be a cause given the patients admission to not drinking enough water. We will monitor labs today to ensure no other concerning findings.  Plan: - Be sure you are staying well hydrated with water. Drink at least 64 ounces of water a day.  - We will check labs today to make sure that your electrolytes are well balanced and that is not contributing to your leg cramps.       Relevant Orders   VITAMIN D 25 Hydroxy (Vit-D Deficiency, Fractures) (Completed)   Magnesium (Completed)   Vitamin D deficiency    Chronic. Currently on 600 iU daily. Will monitor labs today.       Relevant Orders   VITAMIN D 25 Hydroxy (Vit-D Deficiency, Fractures) (Completed)   Encounter for annual physical exam - Primary    CPE today with no abnormalities noted on exam.  Labs pending. Will make changes as necessary based on results.  Review of HM activities and recommendations discussed and provided on AVS Anticipatory guidance, diet, and exercise recommendations provided.  Medications, allergies, and hx reviewed and updated as necessary.  Plan to f/u with CPE in 1 year or sooner for acute/chronic health needs as directed.        Elevated lipids   Other Visit Diagnoses     Screening mammogram for breast cancer       Relevant Orders   MM 3D SCREENING MAMMOGRAM BILATERAL BREAST   Screening for osteoporosis       Relevant Orders   DG Bone Density   Need for viral immunization       Health care maintenance       Relevant Orders   Ambulatory Referral for Lung Cancer Scre    DG Bone Density   MM 3D SCREENING MAMMOGRAM BILATERAL BREAST   CBC with Differential/Platelet (Completed)   Comprehensive metabolic panel (Completed)   Lipid panel (Completed)          Follow up plan: Return in about 1 year (around 01/26/2024) for CPE .  NEXT PREVENTATIVE PHYSICAL DUE IN 1 YEAR.  PATIENT COUNSELING PROVIDED FOR ALL ADULT PATIENTS:  Consume a well balanced diet low in saturated fats, cholesterol, and moderation in carbohydrates.   This can be as simple as monitoring portion sizes and cutting back on sugary beverages such as soda and juice to start with.    Daily water consumption of at least 64 ounces.  Physical activity at least 180 minutes per week, if just starting out.   This can be as simple as taking the stairs instead of the elevator and walking 2-3 laps around the office  purposefully every day.   STD protection, partner selection, and regular testing if high risk.  Limited consumption of alcoholic beverages if alcohol is consumed.  For women, I recommend no more than 7 alcoholic beverages per week, spread out throughout the week.  Avoid "binge" drinking or consuming large quantities of alcohol in one setting.   Please let me know if you feel you may need help with reduction or quitting alcohol consumption.   Avoidance of nicotine, if used.  Please let me know  if you feel you may need help with reduction or quitting nicotine use.   Daily mental health attention.  This can be in the form of 5 minute daily meditation, prayer, journaling, yoga, reflection, etc.   Purposeful attention to your emotions and mental state can significantly improve your overall wellbeing and Health.  Please know that I am here to help you with all of your health care goals and am happy to work with you to find a solution that works best for you.  The greatest advice I have received with any changes in life are to take it one step at a time, that even means if all you can focus  on is the next 60 seconds, then do that and celebrate your victories.  With any changes in life, you will have set backs, and that is OK. The important thing to remember is, if you have a set back, it is not a failure, it is an opportunity to try again!  Health Maintenance Recommendations Screening Testing Mammogram Every 1 -2 years based on history and risk factors Starting at age 78 Pap Smear Ages 21-39 every 3 years Ages 3-65 every 5 years with HPV testing More frequent testing may be required based on results and history Colon Cancer Screening Every 1-10 years based on test performed, risk factors, and history Starting at age 11 Bone Density Screening Every 2-10 years based on history Starting at age 50 for women Recommendations for men differ based on medication usage, history, and risk factors AAA Screening One time ultrasound Men 28-27 years old who have every smoked Lung Cancer Screening Low Dose Lung CT every 12 months Age 94-80 years with a 30 pack-year smoking history who still smoke or who have quit within the last 15 years  Screening Labs Routine  Labs: Complete Blood Count (CBC), Complete Metabolic Panel (CMP), Cholesterol (Lipid Panel) Every 6-12 months based on history and medications May be recommended more frequently based on current conditions or previous results Hemoglobin A1c Lab Every 3-12 months based on history and previous results Starting at age 65 or earlier with diagnosis of diabetes, high cholesterol, BMI >26, and/or risk factors Frequent monitoring for patients with diabetes to ensure blood sugar control Thyroid Panel (TSH w/ T3 & T4) Every 6 months based on history, symptoms, and risk factors May be repeated more often if on medication HIV One time testing for all patients 19 and older May be repeated more frequently for patients with increased risk factors or exposure Hepatitis C One time testing for all patients 4 and older May be repeated  more frequently for patients with increased risk factors or exposure Gonorrhea, Chlamydia Every 12 months for all sexually active persons 13-24 years Additional monitoring may be recommended for those who are considered high risk or who have symptoms PSA Men 36-37 years old with risk factors Additional screening may be recommended from age 23-69 based on risk factors, symptoms, and history  Vaccine Recommendations Tetanus Booster All adults every 10 years Flu Vaccine All patients 6 months and older every year COVID Vaccine All patients 12 years and older Initial dosing with booster May recommend additional booster based on age and health history HPV Vaccine 2 doses all patients age 51-26 Dosing may be considered for patients over 26 Shingles Vaccine (Shingrix) 2 doses all adults 55 years and older Pneumonia (Pneumovax 23) All adults 65 years and older May recommend earlier dosing based on health history Pneumonia (Prevnar 13) All adults 65 years and  older Dosed 1 year after Pneumovax 23  Additional Screening, Testing, and Vaccinations may be recommended on an individualized basis based on family history, health history, risk factors, and/or exposure.

## 2023-01-27 LAB — LIPID PANEL
Chol/HDL Ratio: 1.8 ratio (ref 0.0–4.4)
Cholesterol, Total: 165 mg/dL (ref 100–199)
HDL: 91 mg/dL (ref >39)
LDL Chol Calc (NIH): 59 mg/dL (ref 0–99)
Triglycerides: 80 mg/dL (ref 0–149)
VLDL Cholesterol Cal: 15 mg/dL (ref 5–40)

## 2023-01-27 LAB — CBC WITH DIFFERENTIAL/PLATELET
Basophils Absolute: 0.1 10*3/uL (ref 0.0–0.2)
Basos: 1 %
EOS (ABSOLUTE): 0.1 10*3/uL (ref 0.0–0.4)
Eos: 2 %
Hematocrit: 40.6 % (ref 34.0–46.6)
Hemoglobin: 13.2 g/dL (ref 11.1–15.9)
Immature Grans (Abs): 0 10*3/uL (ref 0.0–0.1)
Immature Granulocytes: 0 %
Lymphocytes Absolute: 3.5 10*3/uL — ABNORMAL HIGH (ref 0.7–3.1)
Lymphs: 63 %
MCH: 28.9 pg (ref 26.6–33.0)
MCHC: 32.5 g/dL (ref 31.5–35.7)
MCV: 89 fL (ref 79–97)
Monocytes Absolute: 0.3 10*3/uL (ref 0.1–0.9)
Monocytes: 6 %
Neutrophils Absolute: 1.6 10*3/uL (ref 1.4–7.0)
Neutrophils: 28 %
Platelets: 328 10*3/uL (ref 150–450)
RBC: 4.56 x10E6/uL (ref 3.77–5.28)
RDW: 13.4 % (ref 11.7–15.4)
WBC: 5.5 10*3/uL (ref 3.4–10.8)

## 2023-01-27 LAB — COMPREHENSIVE METABOLIC PANEL
ALT: 15 IU/L (ref 0–32)
AST: 17 IU/L (ref 0–40)
Albumin/Globulin Ratio: 1.9 (ref 1.2–2.2)
Albumin: 4.7 g/dL (ref 3.9–4.9)
Alkaline Phosphatase: 90 IU/L (ref 44–121)
BUN/Creatinine Ratio: 15 (ref 12–28)
BUN: 12 mg/dL (ref 8–27)
Bilirubin Total: 0.9 mg/dL (ref 0.0–1.2)
CO2: 24 mmol/L (ref 20–29)
Calcium: 10 mg/dL (ref 8.7–10.3)
Chloride: 103 mmol/L (ref 96–106)
Creatinine, Ser: 0.78 mg/dL (ref 0.57–1.00)
Globulin, Total: 2.5 g/dL (ref 1.5–4.5)
Glucose: 89 mg/dL (ref 70–99)
Potassium: 4.1 mmol/L (ref 3.5–5.2)
Sodium: 141 mmol/L (ref 134–144)
Total Protein: 7.2 g/dL (ref 6.0–8.5)
eGFR: 83 mL/min/{1.73_m2} (ref >59)

## 2023-01-27 LAB — VITAMIN D 25 HYDROXY (VIT D DEFICIENCY, FRACTURES): Vit D, 25-Hydroxy: 14.3 ng/mL — ABNORMAL LOW (ref 30.0–100.0)

## 2023-01-27 LAB — MAGNESIUM: Magnesium: 2.2 mg/dL (ref 1.6–2.3)

## 2023-02-01 ENCOUNTER — Encounter: Payer: Self-pay | Admitting: Nurse Practitioner

## 2023-02-01 NOTE — Assessment & Plan Note (Signed)
Current every day smoker working on quitting. Patient praised for her efforts. She is in agreement to low dose lung CT for screening today.  Plan: - I have placed the order for a lung cancer screening. Someone will call you to set this up.

## 2023-02-01 NOTE — Assessment & Plan Note (Signed)
Discussion and recommendation for shingles vaccine today. Prescription printed and provided to the patient to take to the pharmacy to have completed.

## 2023-02-01 NOTE — Assessment & Plan Note (Signed)

## 2023-02-01 NOTE — Assessment & Plan Note (Signed)
Chronic. Currently on 600 iU daily. Will monitor labs today.

## 2023-02-01 NOTE — Assessment & Plan Note (Signed)
Suspect that dehydration may be a cause given the patients admission to not drinking enough water. We will monitor labs today to ensure no other concerning findings.  Plan: - Be sure you are staying well hydrated with water. Drink at least 64 ounces of water a day.  - We will check labs today to make sure that your electrolytes are well balanced and that is not contributing to your leg cramps.

## 2023-02-08 MED ORDER — VITAMIN D3 1.25 MG (50000 UT) PO TABS
1.0000 | ORAL_TABLET | ORAL | 1 refills | Status: DC
Start: 2023-02-08 — End: 2023-08-12

## 2023-02-08 NOTE — Addendum Note (Signed)
Addended by: Kemya Shed, Huntley Dec E on: 02/08/2023 06:25 PM   Modules accepted: Orders

## 2023-02-10 ENCOUNTER — Other Ambulatory Visit: Payer: Self-pay | Admitting: Medical

## 2023-02-10 ENCOUNTER — Encounter: Payer: Self-pay | Admitting: Medical

## 2023-02-10 ENCOUNTER — Ambulatory Visit (INDEPENDENT_AMBULATORY_CARE_PROVIDER_SITE_OTHER): Payer: Medicare HMO | Admitting: Medical

## 2023-02-10 VITALS — BP 120/70 | HR 92 | Temp 97.9°F | Ht 64.5 in | Wt 154.2 lb

## 2023-02-10 DIAGNOSIS — R1013 Epigastric pain: Secondary | ICD-10-CM

## 2023-02-10 DIAGNOSIS — R112 Nausea with vomiting, unspecified: Secondary | ICD-10-CM | POA: Diagnosis not present

## 2023-02-10 LAB — POCT URINALYSIS DIP (PROADVANTAGE DEVICE)
Glucose, UA: NEGATIVE mg/dL
Nitrite, UA: NEGATIVE
Protein Ur, POC: 100 mg/dL — AB
Specific Gravity, Urine: 1.03
Urobilinogen, Ur: 0.2
pH, UA: 6 (ref 5.0–8.0)

## 2023-02-10 MED ORDER — PROMETHAZINE HCL 25 MG/ML IJ SOLN
12.5000 mg | Freq: Once | INTRAMUSCULAR | Status: AC
Start: 2023-02-10 — End: 2023-02-10
  Administered 2023-02-10: 12.5 mg via INTRAMUSCULAR

## 2023-02-10 MED ORDER — ONDANSETRON 4 MG PO TBDP: 4.0000 mg | ORAL_TABLET | ORAL | 0 refills | Status: DC | PRN

## 2023-02-10 MED ORDER — OMEPRAZOLE 40 MG PO CPDR: 40.0000 mg | DELAYED_RELEASE_CAPSULE | Freq: Every day | ORAL | 0 refills | Status: DC

## 2023-02-10 NOTE — Patient Instructions (Addendum)
Nausea and vomiting, epigastric pain Begin Zofran as needed 1 tablet every 4-6 hours for nausea and vomiting.  This medicine will dissolve on the tongue Begin omeprazole 40 mg, 1 capsule this evening but then starting tomorrow take it in the morning about 30 minutes before breakfast daily for the next 2 weeks You can use Tums as needed for acid indigestion I recommend you begin Pepto-Bismol twice daily for the next 5 days.  This is over-the-counter Pepto-Bismol We gave you an injection of Phenergan/promethazine today to help with nausea and vomiting As your nausea improves, try to increase your fluids significantly in the next few days As your nausea improves you can add back brat diet for a few days, bananas, rice, applesauce, toast, soup broth and other small portions of easily digestible foods If you are much worse over the weekend and go to the emergency department   Causes could be food poisoning, maybe her cough and congestion last week got a gag reflex going.  It is not clear if something else is causing her symptoms.  If you are not much improved by Monday then we should probably get some labs and possibly even another scan of your abdomen

## 2023-02-10 NOTE — Progress Notes (Signed)
Subjective:  Nicole Joyce is a 67 y.o. female who presents for Chief Complaint  Patient presents with   Nausea    Nausea and vomiting that started this weekend and worsened as the week went on. Not eating much. Been eating fruit and had some OJ, but vomited around 10:30.     Here for nausea and vomiting.   Has been feeling bad about a week.  This past weekend was congested with mucous, coughing some, but that got better.  Since then has had some vomiting and nausea all week about 5 days.  Last BM 5 days ago.   Hasn't been able to keep much food down this week, but has been able to keep some water down.  Feels uneasy in upper abdomen.    No fever, had some chills some this week but no body aches  No diarrhea, no constipation.   No urinary complaint, no polyuria, no blood in urine, no frequency, no urgency ,no odor in urine.  She does notes some mid to upper abdominal pain.     No back pain.   No alcohol in last week, drinks some alcohol but not regularly.    No drugs.   No travel  She notes that she thinks it could be related to food poisoning.  She ate some meatballs 2 days in a row that did not taste quite right prior to the nausea and vomiting starting  No other aggravating or relieving factors.    No other c/o.  Past Medical History:  Diagnosis Date   Abnormal lung sounds 11/18/2020   Anemia    Asthma    Bilateral arm pain 02/03/2020   Dysmenorrhea    Elevated lipids    Endometriosis    Estrogen deficiency 11/18/2020   Fall 01/05/2016   Hormone disorder    Infertility, female    Need for COVID-19 vaccine 12/02/2021   PID (pelvic inflammatory disease)    Current Outpatient Medications on File Prior to Visit  Medication Sig Dispense Refill   albuterol (VENTOLIN HFA) 108 (90 Base) MCG/ACT inhaler Inhale 2 puffs into the lungs every 6 (six) hours as needed for wheezing or shortness of breath. 18 g 0   atorvastatin (LIPITOR) 10 MG tablet TAKE 1 TABLET BY MOUTH EVERY DAY  90 tablet 0   Cholecalciferol (VITAMIN D3) 1.25 MG (50000 UT) TABS Take 1 tablet by mouth once a week. (Patient not taking: Reported on 02/10/2023) 12 tablet 1   No current facility-administered medications on file prior to visit.     The following portions of the patient's history were reviewed and updated as appropriate: allergies, current medications, past family history, past medical history, past social history, past surgical history and problem list.  ROS Otherwise as in subjective above  Objective: BP 120/70   Pulse 92   Temp 97.9 F (36.6 C) (Tympanic)   Ht 5' 4.5" (1.638 m)   Wt 154 lb 3.2 oz (69.9 kg)   SpO2 97%   BMI 26.06 kg/m   Wt Readings from Last 3 Encounters:  02/10/23 154 lb 3.2 oz (69.9 kg)  01/26/23 163 lb 3.2 oz (74 kg)  12/27/22 160 lb (72.6 kg)    General appearance: alert, no distress, well developed, well nourished HEENT: normocephalic, sclerae anicteric, conjunctiva pink and moist, TMs pearly, nares patent, no discharge or erythema, pharynx normal Oral cavity: MMM, no lesions Neck: supple, no lymphadenopathy, no thyromegaly, no masses Heart: RRR, normal S1, S2, no murmurs Lungs: CTA bilaterally, no  wheezes, rhonchi, or rales Abdomen: +bs, soft, port surgical scars around umbilicus, +epigastric tenderness, otherwise non tender, non distended, no masses, no hepatomegaly, no splenomegaly Back: nontender Pulses: 2+ radial pulses, 2+ pedal pulses, normal cap refill Ext: no edema   Assessment: Encounter Diagnoses  Name Primary?   Nausea and vomiting, unspecified vomiting type Yes   Epigastric pain      Plan: We discussed her symptoms and concerns.  I suspect this is related to food poisoning.  No other obvious triggers.  Of note she had labs on 01/26/2023 here for physical.  I reviewed those labs.  She also had a CT scan of her abdomen in September 2023 where she had a kidney stone but that resolved.  Gave the option of lab work today but she  declines.  I gave her a 12.5 mg injection of promethazine Phenergan today.  She lives just down the street so she will go straight home.  She is married and lives with her husband.  Patient Instructions  Nausea and vomiting, epigastric pain Begin Zofran as needed 1 tablet every 4-6 hours for nausea and vomiting.  This medicine will dissolve on the tongue Begin omeprazole 40 mg, 1 capsule this evening but then starting tomorrow take it in the morning about 30 minutes before breakfast daily for the next 2 weeks You can use Tums as needed for acid indigestion I recommend you begin Pepto-Bismol twice daily for the next 5 days.  This is over-the-counter Pepto-Bismol We gave you an injection of Phenergan/promethazine today to help with nausea and vomiting As your nausea improves, try to increase your fluids significantly in the next few days As your nausea improves you can add back brat diet for a few days, bananas, rice, applesauce, toast, soup broth and other small portions of easily digestible foods If you are much worse over the weekend and go to the emergency department   Causes could be food poisoning, maybe her cough and congestion last week got a gag reflex going.  It is not clear if something else is causing her symptoms.  If you are not much improved by Monday then we should probably get some labs and possibly even another scan of your abdomen      Nicole Joyce was seen today for nausea.  Diagnoses and all orders for this visit:  Nausea and vomiting, unspecified vomiting type -     POCT Urinalysis DIP (Proadvantage Device) -     promethazine (PHENERGAN) injection 12.5 mg  Epigastric pain -     POCT Urinalysis DIP (Proadvantage Device) -     promethazine (PHENERGAN) injection 12.5 mg  Other orders -     omeprazole (PRILOSEC) 40 MG capsule; Take 1 capsule (40 mg total) by mouth daily. -     ondansetron (ZOFRAN-ODT) 4 MG disintegrating tablet; Take 1 tablet (4 mg total) by mouth every  4 (four) hours as needed for nausea or vomiting.    Follow up: prn
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
## 2023-03-04 ENCOUNTER — Other Ambulatory Visit: Payer: Self-pay | Admitting: Medical

## 2023-04-03 ENCOUNTER — Other Ambulatory Visit: Payer: Self-pay | Admitting: Family Medicine

## 2023-04-03 ENCOUNTER — Ambulatory Visit
Admission: RE | Admit: 2023-04-03 | Discharge: 2023-04-03 | Disposition: A | Discharge status: Home / Self Care | Payer: Medicare PPO | Source: Ambulatory Visit | Attending: Nurse Practitioner

## 2023-04-03 DIAGNOSIS — Z1231 Encounter for screening mammogram for malignant neoplasm of breast: Secondary | ICD-10-CM

## 2023-04-03 DIAGNOSIS — J4520 Mild intermittent asthma, uncomplicated: Secondary | ICD-10-CM

## 2023-04-03 DIAGNOSIS — Z0000 Encounter for general adult medical examination without abnormal findings: Secondary | ICD-10-CM

## 2023-04-03 MED ORDER — ALBUTEROL SULFATE HFA 108 (90 BASE) MCG/ACT IN AERS
2.0000 | INHALATION_SPRAY | Freq: Four times a day (QID) | RESPIRATORY_TRACT | 1 refills | Status: DC | PRN
Start: 2023-04-03 — End: 2024-01-03

## 2023-05-18 ENCOUNTER — Ambulatory Visit (INDEPENDENT_AMBULATORY_CARE_PROVIDER_SITE_OTHER): Payer: Medicare PPO | Admitting: Medical

## 2023-05-18 VITALS — BP 122/76 | HR 64 | Temp 97.7°F | Wt 163.2 lb

## 2023-05-18 DIAGNOSIS — Z9889 Other specified postprocedural states: Secondary | ICD-10-CM | POA: Diagnosis not present

## 2023-05-18 DIAGNOSIS — M25511 Pain in right shoulder: Secondary | ICD-10-CM | POA: Diagnosis not present

## 2023-05-18 DIAGNOSIS — R29898 Other symptoms and signs involving the musculoskeletal system: Secondary | ICD-10-CM | POA: Diagnosis not present

## 2023-05-18 DIAGNOSIS — G8929 Other chronic pain: Secondary | ICD-10-CM

## 2023-05-18 DIAGNOSIS — M7551 Bursitis of right shoulder: Secondary | ICD-10-CM

## 2023-05-18 MED ORDER — NAPROXEN 375 MG PO TABS: 375.0000 mg | ORAL_TABLET | Freq: Two times a day (BID) | ORAL | 0 refills | Status: DC

## 2023-05-18 NOTE — Progress Notes (Signed)
Subjective:  Nicole Joyce is a 67 y.o. female who presents for Chief Complaint  Patient presents with   right shoulder pain    Right shoulder pain - pain level 10/10. Can't lift arm, hasn't fallen or remember doing anything     Here for right shoulder and arm pain.  Has hx/o chronic right shoulder pain.    Has hx/o torn rotator cuff right side years ago.  Feels heavy, feels weak, having lots of pain.  Worse starting today.  Has some right neck pain as well.  Didn't sleep all night due to pain, tossing and turning.  No recent injury, fall or trauma.   Right handed.  Works as a  Runner, broadcasting/film/video.   Been doing some yard work, Personal assistant, helping move stuff around in the house.    Tried some heat.    No other aggravating or relieving factors.    No other c/o.  Past Medical History:  Diagnosis Date   Abnormal lung sounds 11/18/2020   Anemia    Asthma    Bilateral arm pain 02/03/2020   Dysmenorrhea    Elevated lipids    Endometriosis    Estrogen deficiency 11/18/2020   Fall 01/05/2016   Hormone disorder    Infertility, female    Need for COVID-19 vaccine 12/02/2021   PID (pelvic inflammatory disease)    Current Outpatient Medications on File Prior to Visit  Medication Sig Dispense Refill   albuterol (VENTOLIN HFA) 108 (90 Base) MCG/ACT inhaler Inhale 2 puffs into the lungs every 6 (six) hours as needed for wheezing or shortness of breath. 18 g 1   atorvastatin (LIPITOR) 10 MG tablet TAKE 1 TABLET BY MOUTH EVERY DAY 90 tablet 0   Cholecalciferol (VITAMIN D3) 1.25 MG (50000 UT) TABS Take 1 tablet by mouth once a week. 12 tablet 1   omeprazole (PRILOSEC) 40 MG capsule TAKE 1 CAPSULE (40 MG TOTAL) BY MOUTH DAILY. (Patient not taking: Reported on 05/18/2023) 90 capsule 1   No current facility-administered medications on file prior to visit.   Past Surgical History:  Procedure Laterality Date   ABDOMINAL SURGERY     exploratory laparotomy with treatment of endometriosis   PELVIC LAPAROSCOPY      ROTATOR CUFF REPAIR      The following portions of the patient's history were reviewed and updated as appropriate: allergies, current medications, past family history, past medical history, past social history, past surgical history and problem list.  ROS Otherwise as in subjective above  Objective: BP 122/76   Pulse 64   Temp 97.7 F (36.5 C)   Wt 163 lb 3.2 oz (74 kg)   BMI 27.58 kg/m   General appearance: alert, no distress, well developed, well nourished Neck: Neck was slightly decreased range of motion for rotation both directions, otherwise range of motion pretty full, mild tenderness posterior lateral neck, no mass no thyromegaly no lymphadenopathy MSK: Active range of motion limited to about 80 degrees of shoulder flexion and abduction, tender over the biceps origin and AC joint, otherwise nontender of right arm and shoulder to palpation, there is some laxity of the right shoulder joint compared to the last, passive range of motion is limited to about 80 degrees as well and she is tender pretty much in every direction, quite limited internal and external range of motion Back: Right upper back paraspinal and supraspinatus region with some tenderness otherwise back nontender Pulses: 2+ radial pulses, 2+ pedal pulses, normal cap refill Ext: no edema Neuro: Some  weakness of the right shoulder with range of motion but rest of arm strength is normal, DTRs and sensation normal, grip strength normal bilateral arms     Assessment: Encounter Diagnoses  Name Primary?   Chronic right shoulder pain Yes   Weakness of right shoulder    Bursitis of right shoulder    History of shoulder surgery      Plan: We discussed her symptoms, findings.  She has chronic shoulder pain but prior to a few days ago had pretty good range of motion so this seems like an acute bursitis with underlying rotator cuff chronic issues.  Patient Instructions  Right shoulder pain  Your symptoms and exam  suggest bursitis and underlying pathology.  Recommendations: Purchase and use an arm sling as in the picture below.  Use this on and off for the next several days to rest the shoulder    Begin Naprosyn prescription twice daily for the next 5 to 7 days for pain and inflammation You have some pain medicine already at home oxycodone or Tylenol 3.  You could use either 1 of these once or twice a day for the next few days for breakthrough pain Use cold therapy with ice water bag 20 minutes at a time several times a day through the weekend Use some gentle stretching this weekend If not much improved within a week then call back I suspect you probably need to go back and have either physical therapy or an updated orthopedic consult as you have laxity of your shoulder and signs of potential weakness of the rotator cuff Let me know if you want a referral to orthopedics or physical therapy in the next week once your pain subsides     [name] was seen today for right shoulder pain.  Diagnoses and all orders for this visit:  Chronic right shoulder pain  Weakness of right shoulder  Bursitis of right shoulder  History of shoulder surgery  Other orders -     naproxen (NAPROSYN) 375 MG tablet; Take 1 tablet (375 mg total) by mouth 2 (two) times daily with a meal.    Follow up: prn

## 2023-05-18 NOTE — Patient Instructions (Addendum)
Right shoulder pain  Your symptoms and exam suggest bursitis and underlying pathology.  Recommendations: Purchase and use an arm sling as in the picture below.  Use this on and off for the next several days to rest the shoulder    Begin Naprosyn prescription twice daily for the next 5 to 7 days for pain and inflammation You have some pain medicine already at home oxycodone or Tylenol 3.  You could use either 1 of these once or twice a day for the next few days for breakthrough pain Use cold therapy with ice water bag 20 minutes at a time several times a day through the weekend Use some gentle stretching this weekend If not much improved within a week then call back I suspect you probably need to go back and have either physical therapy or an updated orthopedic consult as you have laxity of your shoulder and signs of potential weakness of the rotator cuff Let me know if you want a referral to orthopedics or physical therapy in the next week once your pain subsides

## 2023-05-19 ENCOUNTER — Encounter: Payer: Self-pay | Admitting: *Deleted

## 2023-06-06 ENCOUNTER — Other Ambulatory Visit: Payer: Self-pay

## 2023-06-06 ENCOUNTER — Encounter (HOSPITAL_COMMUNITY): Payer: Self-pay | Admitting: Emergency Medicine

## 2023-06-06 ENCOUNTER — Emergency Department (HOSPITAL_COMMUNITY)
Admission: EM | Admit: 2023-06-06 | Discharge: 2023-06-07 | Disposition: A | Discharge status: Home / Self Care | Payer: Medicare PPO | Attending: Emergency Medicine | Admitting: Emergency Medicine

## 2023-06-06 ENCOUNTER — Emergency Department (HOSPITAL_COMMUNITY): Payer: Medicare PPO

## 2023-06-06 DIAGNOSIS — J45909 Unspecified asthma, uncomplicated: Secondary | ICD-10-CM | POA: Insufficient documentation

## 2023-06-06 DIAGNOSIS — J181 Lobar pneumonia, unspecified organism: Secondary | ICD-10-CM | POA: Insufficient documentation

## 2023-06-06 DIAGNOSIS — R0602 Shortness of breath: Secondary | ICD-10-CM | POA: Insufficient documentation

## 2023-06-06 DIAGNOSIS — R0781 Pleurodynia: Secondary | ICD-10-CM | POA: Diagnosis present

## 2023-06-06 DIAGNOSIS — J189 Pneumonia, unspecified organism: Secondary | ICD-10-CM

## 2023-06-06 LAB — CBC WITH DIFFERENTIAL/PLATELET
Abs Immature Granulocytes: 0.05 10*3/uL (ref 0.00–0.07)
Basophils Absolute: 0.1 10*3/uL (ref 0.0–0.1)
Basophils Relative: 0 %
Eosinophils Absolute: 0 10*3/uL (ref 0.0–0.5)
Eosinophils Relative: 0 %
HCT: 37.3 % (ref 36.0–46.0)
Hemoglobin: 12.6 g/dL (ref 12.0–15.0)
Immature Granulocytes: 0 %
Lymphocytes Relative: 19 %
Lymphs Abs: 2.3 10*3/uL (ref 0.7–4.0)
MCH: 29.2 pg (ref 26.0–34.0)
MCHC: 33.8 g/dL (ref 30.0–36.0)
MCV: 86.3 fL (ref 80.0–100.0)
Monocytes Absolute: 0.8 10*3/uL (ref 0.1–1.0)
Monocytes Relative: 7 %
Neutro Abs: 8.9 10*3/uL — ABNORMAL HIGH (ref 1.7–7.7)
Neutrophils Relative %: 74 %
Platelets: 421 10*3/uL — ABNORMAL HIGH (ref 150–400)
RBC: 4.32 MIL/uL (ref 3.87–5.11)
RDW: 14.5 % (ref 11.5–15.5)
WBC: 12.2 10*3/uL — ABNORMAL HIGH (ref 4.0–10.5)
nRBC: 0 % (ref 0.0–0.2)

## 2023-06-06 LAB — BASIC METABOLIC PANEL
Anion gap: 13 (ref 5–15)
BUN: 20 mg/dL (ref 8–23)
CO2: 28 mmol/L (ref 22–32)
Calcium: 9.6 mg/dL (ref 8.9–10.3)
Chloride: 101 mmol/L (ref 98–111)
Creatinine, Ser: 1.24 mg/dL — ABNORMAL HIGH (ref 0.44–1.00)
GFR, Estimated: 48 mL/min — ABNORMAL LOW (ref >60)
Glucose, Bld: 127 mg/dL — ABNORMAL HIGH (ref 70–99)
Potassium: 3.5 mmol/L (ref 3.5–5.1)
Sodium: 142 mmol/L (ref 135–145)

## 2023-06-06 LAB — BRAIN NATRIURETIC PEPTIDE: B Natriuretic Peptide: 22.8 pg/mL (ref 0.0–100.0)

## 2023-06-06 MED ORDER — MORPHINE SULFATE (PF) 4 MG/ML IV SOLN
4.0000 mg | Freq: Once | INTRAVENOUS | Status: AC
  Administered 2023-06-07: 4 mg via INTRAVENOUS
  Filled 2023-06-06: qty 1

## 2023-06-06 MED ORDER — ONDANSETRON HCL 4 MG/2ML IJ SOLN
4.0000 mg | Freq: Once | INTRAMUSCULAR | Status: AC
  Administered 2023-06-07: 4 mg via INTRAVENOUS
  Filled 2023-06-06: qty 2

## 2023-06-06 NOTE — ED Provider Triage Note (Signed)
Emergency Medicine Provider Triage Evaluation Note  Nicole Joyce , a 67 y.o. female  was evaluated in triage.  Pt complains of right lower back pain for the past 6 days.  Diagnosed with a pulled muscle given muscle relaxers, narcotics to help with pain control without any improvement in symptoms.  Patient reports the pain is exacerbated with any inspiration.  Feels like something "catches ", at her right lower lobe.  She does not have any history of cancer, no unopposed estrogen.  No prior history of blood clots.  Patient did have a large episode of nonbilious emesis here today.  Not endorsing any right upper quadrant pain.  Review of Systems  Positive: Chest pain, sob Negative: Fever, cough, trauma, urinary symptoms  Physical Exam  BP 121/79 (BP Location: Left Arm)   Pulse (!) 104   Temp 98 F (36.7 C) (Oral)   Resp 14   Ht 5\' 4"  (1.626 m)   Wt 74 kg   SpO2 96%   BMI 28.00 kg/m  Gen:   Awake, no distress   Resp:  Normal effort  MSK:   Moves extremities without difficulty  Other:  Pain is not reproducible with palpation.   Medical Decision Making  Medically screening exam initiated at 9:44 PM.  Appropriate orders placed.  Nicole Joyce was informed that the remainder of the evaluation will be completed by another provider, this initial triage assessment does not replace that evaluation, and the importance of remaining in the ED until their evaluation is complete.  Concern for PE in the setting of sob worsen with inspiration. Slight elevated HR 103 and decrease O2 at 96%   Claude Manges, PA-C 06/06/23 [id number]

## 2023-06-06 NOTE — ED Triage Notes (Signed)
Patient reports starting 5 days while sleeping she had a pain while taking a deep breath that is causing the R side of her chest, R breast and wraps around to the R side and back. Saw PCP and they thought she just strained her muscle was given muscle relaxers but this has not improved since. States its a sharp pain that comes in goes. While in triage patient profusely vomited.

## 2023-06-07 ENCOUNTER — Emergency Department (HOSPITAL_COMMUNITY): Payer: Medicare PPO

## 2023-06-07 MED ORDER — IOHEXOL 350 MG/ML SOLN
75.0000 mL | Freq: Once | INTRAVENOUS | Status: AC | PRN
  Administered 2023-06-07: 75 mL via INTRAVENOUS

## 2023-06-07 MED ORDER — AMOXICILLIN-POT CLAVULANATE 875-125 MG PO TABS: 1.0000 | ORAL_TABLET | Freq: Two times a day (BID) | ORAL | 0 refills | Status: DC

## 2023-06-07 MED ORDER — SODIUM CHLORIDE 0.9 % IV SOLN
1.0000 g | Freq: Once | INTRAVENOUS | Status: AC
  Administered 2023-06-07: 1 g via INTRAVENOUS
  Filled 2023-06-07: qty 10

## 2023-06-07 MED ORDER — SODIUM CHLORIDE 0.9 % IV SOLN
500.0000 mg | Freq: Once | INTRAVENOUS | Status: AC
  Administered 2023-06-07: 500 mg via INTRAVENOUS
  Filled 2023-06-07: qty 5

## 2023-06-07 MED ORDER — AZITHROMYCIN 250 MG PO TABS: 250.0000 mg | ORAL_TABLET | Freq: Every day | ORAL | 0 refills | Status: DC

## 2023-06-07 NOTE — Discharge Instructions (Signed)
You were seen today for chest discomfort.  You were found to have an effusion of the left lung that is likely related to infection.  Take antibiotics as prescribed.  Continue pain medications at home.  If you develop new or worsening symptoms, you should be reevaluated.

## 2023-06-07 NOTE — ED Notes (Signed)
Pt ambulated per MD order for 3 mins with O2 saturation maintaining at 96% with no SOB or difficulty

## 2023-06-07 NOTE — ED Provider Notes (Signed)
[place] EMERGENCY DEPARTMENT AT [facility] Provider Note   CSN: [id number] Arrival date & time: 06/06/23  2101     History  Chief Complaint  Patient presents with   Shortness of Breath    Nicole Joyce is a 67 y.o. female.  HPI      This is a 67 year old female who presents with right sided chest pain.  Patient reports gradually worsening pleuritic right-sided chest pain.  She states it has worsened over the last week.  It is sharp and makes her breath catch.  She has not had any fevers or cough.  She is a smoker.  Denies significant improvement with narcotics and muscle relaxants.  No history of blood clots.  Denies leg swelling.   Home Medications Prior to Admission medications   Medication Sig Start Date End Date Taking? Authorizing Provider  amoxicillin-clavulanate (AUGMENTIN) 875-125 MG tablet Take 1 tablet by mouth every 12 (twelve) hours. 06/07/23  Yes Kilea Mccarey, Mayer Masker, MD  azithromycin (ZITHROMAX) 250 MG tablet Take 1 tablet (250 mg total) by mouth daily. Take first 2 tablets together, then 1 every day until finished. 06/07/23  Yes Brewer Hitchman, Mayer Masker, MD  albuterol (VENTOLIN HFA) 108 (90 Base) MCG/ACT inhaler Inhale 2 puffs into the lungs every 6 (six) hours as needed for wheezing or shortness of breath. 04/03/23   Tollie Eth, NP  atorvastatin (LIPITOR) 10 MG tablet TAKE 1 TABLET BY MOUTH EVERY DAY 12/30/22   Early, Sung Amabile, NP  Cholecalciferol (VITAMIN D3) 1.25 MG (50000 UT) TABS Take 1 tablet by mouth once a week. 02/08/23   Tollie Eth, NP  naproxen (NAPROSYN) 375 MG tablet Take 1 tablet (375 mg total) by mouth 2 (two) times daily with a meal. 05/18/23   Tysinger, Kermit Balo, PA-C  omeprazole (PRILOSEC) 40 MG capsule TAKE 1 CAPSULE (40 MG TOTAL) BY MOUTH DAILY. Patient not taking: Reported on 05/18/2023 03/06/23   Tollie Eth, NP      Allergies    Patient has no known allergies.    Review of Systems   Review of Systems  Constitutional:  Negative for  fever.  Respiratory:  Negative for cough and shortness of breath.   Cardiovascular:  Positive for chest pain. Negative for leg swelling.  All other systems reviewed and are negative.   Physical Exam Updated Vital Signs BP 113/80   Pulse 84   Temp 98.2 F (36.8 C) (Oral)   Resp 18   Ht 1.626 m (5\' 4" )   Wt 74 kg   SpO2 96%   BMI 28.00 kg/m  Physical Exam Vitals and nursing note reviewed.  Constitutional:      Appearance: She is well-developed.     Comments: Uncomfortable appearing, nontoxic  HENT:     Head: Normocephalic and atraumatic.  Eyes:     Pupils: Pupils are equal, round, and reactive to light.  Cardiovascular:     Rate and Rhythm: Normal rate and regular rhythm.     Heart sounds: Normal heart sounds.  Pulmonary:     Effort: Pulmonary effort is normal. No respiratory distress.     Breath sounds: No wheezing.     Comments: Diminished breath sounds right lower lobe Abdominal:     General: Bowel sounds are normal.     Palpations: Abdomen is soft.  Musculoskeletal:     Cervical back: Neck supple.  Skin:    General: Skin is warm and dry.  Neurological:     Mental Status: She  is alert and oriented to person, place, and time.  Psychiatric:        Mood and Affect: Mood normal.     ED Results / Procedures / Treatments   Labs (all labs ordered are listed, but only abnormal results are displayed) Labs Reviewed  BASIC METABOLIC PANEL - Abnormal; Notable for the following components:      Result Value   Glucose, Bld 127 (*)    Creatinine, Ser 1.24 (*)    GFR, Estimated 48 (*)    All other components within normal limits  CBC WITH DIFFERENTIAL/PLATELET - Abnormal; Notable for the following components:   WBC 12.2 (*)    Platelets 421 (*)    Neutro Abs 8.9 (*)    All other components within normal limits  BRAIN NATRIURETIC PEPTIDE    EKG EKG Interpretation Date/Time:  Tuesday June 06 2023 21:21:14 EDT Ventricular Rate:  106 PR Interval:  160 QRS  Duration:  68 QT Interval:  334 QTC Calculation: 443 R Axis:   43  Text Interpretation: Sinus tachycardia Right atrial enlargement Borderline ECG When compared with ECG of 26-Jun-2012 18:57, PREVIOUS ECG IS PRESENT Confirmed by Ross Marcus ([id number]) on 06/06/2023 11:46:31 PM  Radiology CT Angio Chest PE W and/or Wo Contrast  Result Date: 06/07/2023 CLINICAL DATA:  Low to intermediate probability pulmonary embolism, positive D-dimer EXAM: CT ANGIOGRAPHY CHEST WITH CONTRAST TECHNIQUE: Multidetector CT imaging of the chest was performed using the standard protocol during bolus administration of intravenous contrast. Multiplanar CT image reconstructions and MIPs were obtained to evaluate the vascular anatomy. RADIATION DOSE REDUCTION: This exam was performed according to the departmental dose-optimization program which includes automated exposure control, adjustment of the mA and/or kV according to patient size and/or use of iterative reconstruction technique. CONTRAST:  75mL OMNIPAQUE IOHEXOL 350 MG/ML SOLN COMPARISON:  None Available. FINDINGS: Cardiovascular: Adequate opacification of the pulmonary arterial tree. No intraluminal filling defect identified to suggest acute pulmonary embolism. Central pulmonary arteries are of normal caliber. Mild coronary artery calcification. Cardiac size within normal limits. No pericardial effusion. Mild atherosclerotic calcification within the thoracic aorta. No aortic aneurysm. Mediastinum/Nodes: No enlarged mediastinal, hilar, or axillary lymph nodes. Thyroid gland, trachea, and esophagus demonstrate no significant findings. Lungs/Pleura: There is focal consolidation within the posterior basal right lower lobe in keeping with changes of acute lobar pneumonia appropriate clinical setting. There is a small associated loculated right basilar pleural effusion in keeping with a parapneumonic effusion or empyema. Scattered airway impaction and bronchial wall thickening  within the right lower lobe in keeping with airway inflammation. Mild background emphysema. Left lung is clear. No pneumothorax. No central obstructing lesion. Upper Abdomen: No acute abnormality. Musculoskeletal: No chest wall abnormality. No acute or significant osseous findings. Review of the MIP images confirms the above findings. IMPRESSION: 1. No pulmonary embolism. 2. Mild emphysema 3. Acute lobar pneumonia within the posterior basal right lower lobe with associated small parapneumonic effusion or empyema. 4. Mild coronary artery calcification. Aortic Atherosclerosis (ICD10-I70.0) and Emphysema (ICD10-J43.9). Electronically Signed   By: Helyn Numbers M.D.   On: 06/07/2023 01:02   DG Chest 2 View  Result Date: 06/06/2023 CLINICAL DATA:  Shortness of breath EXAM: CHEST - 2 VIEW COMPARISON:  Chest x-ray 11/18/2020 FINDINGS: There is a small right pleural effusion with minimal right basilar patchy opacities. There is no pneumothorax. The cardiomediastinal silhouette is within normal limits. No acute fractures are seen. Soft tissue calcification in the right axilla is unchanged. IMPRESSION: Small right pleural effusion  with minimal right basilar patchy opacities which could reflect atelectasis or infection. Electronically Signed   By: Darliss Cheney M.D.   On: 06/06/2023 22:25    Procedures Procedures    Medications Ordered in ED Medications  morphine (PF) 4 MG/ML injection 4 mg (4 mg Intravenous Given 06/07/23 0017)  ondansetron (ZOFRAN) injection 4 mg (4 mg Intravenous Given 06/07/23 0017)  iohexol (OMNIPAQUE) 350 MG/ML injection 75 mL (75 mLs Intravenous Contrast Given 06/07/23 0042)  cefTRIAXone (ROCEPHIN) 1 g in sodium chloride 0.9 % 100 mL IVPB (0 g Intravenous Stopped 06/07/23 0227)  azithromycin (ZITHROMAX) 500 mg in sodium chloride 0.9 % 250 mL IVPB (0 mg Intravenous Stopped 06/07/23 0227)    ED Course/ Medical Decision Making/ A&P                                 Medical Decision  Making Risk Prescription drug management.   This patient presents to the ED for concern of right-sided chest pain, this involves an extensive number of treatment options, and is a complaint that carries with it a high risk of complications and morbidity.  I considered the following differential and admission for this acute, potentially life threatening condition.  The differential diagnosis includes ACS, PE, pneumothorax, pneumonia, chest wall pain  MDM:    This is a 67 year old female who presents with right-sided chest pain.  She is overall nontoxic and vital signs are reassuring.  She is afebrile.  She is uncomfortable appearing on exam but in no respiratory distress.  Labs reviewed.  White count 12.2.  Creatinine 1.24.  Clinically she does not have symptoms suggestive of pneumonia but chest x-ray shows what appears to be right lower lobe pneumonia and possible effusion.  EKG without acute ischemia or arrhythmia.  PE would be high on my list given how uncomfortable she appears.  CT study was obtained and does not show any evidence of pulmonary embolism but does indicate a right lower lobe pneumonia with parapneumonic effusion.  She does have a leukocytosis.  She was given Rocephin and azithromycin.  She was able to maintain her pulse ox at 96% with ambulation.  Discussed with the patient and her daughter that I would start her on antibiotics.  If she has new or worsening symptoms or is not improving, she needs to be reevaluated.  PSI score 77.  Patient does not appear ill or septic appearing.  (Labs, imaging, consults)  Labs: I Ordered, and personally interpreted labs.  The pertinent results include: CBC, BMP, BNP  Imaging Studies ordered: I ordered imaging studies including chest x-ray, CT study I independently visualized and interpreted imaging. I agree with the radiologist interpretation  Additional history obtained from chart review.  External records from outside source obtained and  reviewed including prior evaluations  Cardiac Monitoring: The patient was maintained on a cardiac monitor.  If on the cardiac monitor, I personally viewed and interpreted the cardiac monitored which showed an underlying rhythm of: Son this rhythm  Reevaluation: After the interventions noted above, I reevaluated the patient and found that they have :improved  Social Determinants of Health:  lives independently  Disposition: Discharge  Co morbidities that complicate the patient evaluation  Past Medical History:  Diagnosis Date   Abnormal lung sounds 11/18/2020   Anemia    Asthma    Bilateral arm pain 02/03/2020   Dysmenorrhea    Elevated lipids    Endometriosis  Estrogen deficiency 11/18/2020   Fall 01/05/2016   Hormone disorder    Infertility, female    Need for COVID-19 vaccine 12/02/2021   PID (pelvic inflammatory disease)      Medicines Meds ordered this encounter  Medications   morphine (PF) 4 MG/ML injection 4 mg   ondansetron (ZOFRAN) injection 4 mg   iohexol (OMNIPAQUE) 350 MG/ML injection 75 mL   cefTRIAXone (ROCEPHIN) 1 g in sodium chloride 0.9 % 100 mL IVPB    Order Specific Question:   Antibiotic Indication:    Answer:   CAP   azithromycin (ZITHROMAX) 500 mg in sodium chloride 0.9 % 250 mL IVPB   azithromycin (ZITHROMAX) 250 MG tablet    Sig: Take 1 tablet (250 mg total) by mouth daily. Take first 2 tablets together, then 1 every day until finished.    Dispense:  6 tablet    Refill:  0   amoxicillin-clavulanate (AUGMENTIN) 875-125 MG tablet    Sig: Take 1 tablet by mouth every 12 (twelve) hours.    Dispense:  20 tablet    Refill:  0    I have reviewed the patients home medicines and have made adjustments as needed  Problem List / ED Course: Problem List Items Addressed This Visit   None Visit Diagnoses     Community acquired pneumonia of right lower lobe of lung    -  Primary   Relevant Medications   cefTRIAXone (ROCEPHIN) 1 g in sodium chloride  0.9 % 100 mL IVPB (Completed)   azithromycin (ZITHROMAX) 500 mg in sodium chloride 0.9 % 250 mL IVPB (Completed)   azithromycin (ZITHROMAX) 250 MG tablet   amoxicillin-clavulanate (AUGMENTIN) 875-125 MG tablet                   Final Clinical Impression(s) / ED Diagnoses Final diagnoses:  Community acquired pneumonia of right lower lobe of lung    Rx / DC Orders ED Discharge Orders          Ordered    azithromycin (ZITHROMAX) 250 MG tablet  Daily        06/07/23 0258    amoxicillin-clavulanate (AUGMENTIN) 875-125 MG tablet  Every 12 hours        06/07/23 0258              Shon Baton, MD 06/07/23 0302

## 2023-06-14 ENCOUNTER — Ambulatory Visit: Payer: Medicare PPO | Admitting: Medical

## 2023-06-14 ENCOUNTER — Ambulatory Visit (INDEPENDENT_AMBULATORY_CARE_PROVIDER_SITE_OTHER): Payer: Medicare PPO | Admitting: Medical

## 2023-06-14 VITALS — BP 104/66 | HR 82 | Temp 98.5°F | Wt 155.2 lb

## 2023-06-14 DIAGNOSIS — J189 Pneumonia, unspecified organism: Secondary | ICD-10-CM | POA: Diagnosis not present

## 2023-06-14 DIAGNOSIS — J439 Emphysema, unspecified: Secondary | ICD-10-CM

## 2023-06-14 DIAGNOSIS — Z87891 Personal history of nicotine dependence: Secondary | ICD-10-CM

## 2023-06-14 NOTE — Patient Instructions (Signed)
Your recent emergency department notes show pneumonia, CT scan showing emphysema lung changes, and you were dehydrated.    Recommendations: Try to drink at least 100 ounces of water a day over the next several days as you are still little dehydrated Try to increase your appetite and food intake this week since you have not eaten much in the last week or 2 You can use a protein shake or meal replacement shakes such as the Ensure samples we gave you today for the next few days You need to begin the albuterol inhaler 1 to 2 puffs at least 3 times a day for the next week Albuterol is a rescue emergency inhaler to use for shortness of breath, cough, wheezing Finish out your antibiotics Rest and gradually resume your activity You will need a repeat chest x-ray in 4 to 6 weeks If you do not feel significantly improved over the next 2 days we may need to add a once daily preventative/maintenance inhaler such as Trelegy or similar If you are worse in the next few days such as not able to eat or drink anything, very weak, if you spike any fever or other new symptoms then call or recheck

## 2023-06-14 NOTE — Progress Notes (Signed)
Subjective:  Nicole Joyce is a 67 y.o. female who presents for Chief Complaint  Patient presents with   Hospitalization Follow-up    Pneumonia but not having any cough or fever. Just having some short winded when moving around     Virtual hospital follow-up.  She went to the emergency department about a week ago for pneumonia, chest pain and difficulty breathing.  She was given an injection of Rocephin 1 g, 1 dose of azithromycin and then sent home on Z-Pak and Augmentin combination.  Currently she doesn't feel back to normal. Mouth is dry, lips are chapped.    Eating but just now got appetite back.  Still has some pain with breathing in.    Has pain in right chest.  Still concerned about her SOB.  Is a former smoker. Does have hx/o asthma.  Quit smoking a month ago.    Has been smoker for 54 years.   Has a new inhaler but hasn't been using this.   Prior to the recent ED visit, would only need albuterol spring and fall.     No other aggravating or relieving factors.    No other c/o.  Past Medical History:  Diagnosis Date   Abnormal lung sounds 11/18/2020   Anemia    Asthma    Bilateral arm pain 02/03/2020   Dysmenorrhea    Elevated lipids    Endometriosis    Estrogen deficiency 11/18/2020   Fall 01/05/2016   Hormone disorder    Infertility, female    Need for COVID-19 vaccine 12/02/2021   PID (pelvic inflammatory disease)    Current Outpatient Medications on File Prior to Visit  Medication Sig Dispense Refill   albuterol (VENTOLIN HFA) 108 (90 Base) MCG/ACT inhaler Inhale 2 puffs into the lungs every 6 (six) hours as needed for wheezing or shortness of breath. 18 g 1   amoxicillin-clavulanate (AUGMENTIN) 875-125 MG tablet Take 1 tablet by mouth every 12 (twelve) hours. 20 tablet 0   atorvastatin (LIPITOR) 10 MG tablet TAKE 1 TABLET BY MOUTH EVERY DAY 90 tablet 0   Cholecalciferol (VITAMIN D3) 1.25 MG (50000 UT) TABS Take 1 tablet by mouth once a week. 12 tablet 1   No  current facility-administered medications on file prior to visit.     The following portions of the patient's history were reviewed and updated as appropriate: allergies, current medications, past family history, past medical history, past social history, past surgical history and problem list.  ROS Otherwise as in subjective above  Objective: BP 104/66   Pulse 82   Temp 98.5 F (36.9 C)   Wt 155 lb 3.2 oz (70.4 kg)   SpO2 95%   BMI 26.64 kg/m   Wt Readings from Last 3 Encounters:  06/14/23 155 lb 3.2 oz (70.4 kg)  06/06/23 163 lb 2.3 oz (74 kg)  05/18/23 163 lb 3.2 oz (74 kg)    General appearance: alert, no distress, well developed, well nourished HEENT: normocephalic, sclerae anicteric, conjunctiva pink and moist, TMs pearly, nares patent, no discharge or erythema, pharynx normal Oral cavity: MMM, no lesions Neck: supple, no lymphadenopathy, no thyromegaly, no masses Heart: RRR, normal S1, S2, no murmurs Lungs: decreased sounds and dullness in RLQ, otherwise faint wheezes and otherwise relatively clear.  Pulses: 2+ radial pulses, 2+ pedal pulses, normal cap refill Ext: no edema   Assessment: Encounter Diagnoses  Name Primary?   Community acquired pneumonia, unspecified laterality Yes   Pulmonary emphysema, unspecified emphysema type (HCC)  Former smoker      Plan: I reviewed her recent emergency department notes, chest CT, chest x-ray, labs.  She just quit smoking about a month ago.  She has CT findings suggesting emphysema which would not be surprising given her 50-year smoking history.  She is improved but not quite back to baseline.  We discussed that it may take a few weeks to get back to normal.  Finish out the antibiotics.  She needs to begin her albuterol as she is not using it currently.  I gave her some sample Ensure shakes today since she is not having much of an appetite and not quite back to eating normally.  We discussed that we will need some  follow-up and repeat chest x-ray in 4 to 6 weeks.  We discussed the following instructions  Patient Instructions  Your recent emergency department notes show pneumonia, CT scan showing emphysema lung changes, and you were dehydrated.    Recommendations: Try to drink at least 100 ounces of water a day over the next several days as you are still little dehydrated Try to increase your appetite and food intake this week since you have not eaten much in the last week or 2 You can use a protein shake or meal replacement shakes such as the Ensure samples we gave you today for the next few days You need to begin the albuterol inhaler 1 to 2 puffs at least 3 times a day for the next week Albuterol is a rescue emergency inhaler to use for shortness of breath, cough, wheezing Finish out your antibiotics Rest and gradually resume your activity You will need a repeat chest x-ray in 4 to 6 weeks If you do not feel significantly improved over the next 2 days we may need to add a once daily preventative/maintenance inhaler such as Trelegy or similar If you are worse in the next few days such as not able to eat or drink anything, very weak, if you spike any fever or other new symptoms then call or recheck    Nicole Joyce was seen today for hospitalization follow-up.  Diagnoses and all orders for this visit:  Community acquired pneumonia, unspecified laterality  Pulmonary emphysema, unspecified emphysema type (HCC)  Former smoker    Follow up: 51mo

## 2023-06-16 ENCOUNTER — Telehealth: Payer: Self-pay

## 2023-06-16 NOTE — Telephone Encounter (Signed)
Transition Care Management Unsuccessful Follow-up Telephone Call  Date of discharge and from where:  06/07/2023 [facility]  Attempts:  1st Attempt  Reason for unsuccessful TCM follow-up call:  Left voice message  Divina Neale [facility]  [facility] Population Health Community Resource Care Guide   [email or web address]  ?? [phone]   Website: [email or web address]  [email or web address]

## 2023-06-16 NOTE — Telephone Encounter (Signed)
Transition Care Management Unsuccessful Follow-up Telephone Call  Date of discharge and from where:  06/07/2023 [facility]  Attempts:  2nd Attempt  Reason for unsuccessful TCM follow-up call:  Left voice message  Tyrees Chopin [facility]  [facility] Population Health Community Resource Care Guide   [email or web address]  ?? [phone]   Website: [email or web address]

## 2023-06-27 ENCOUNTER — Ambulatory Visit (INDEPENDENT_AMBULATORY_CARE_PROVIDER_SITE_OTHER): Payer: Medicare PPO | Admitting: Nurse Practitioner

## 2023-06-27 ENCOUNTER — Encounter: Payer: Self-pay | Admitting: Nurse Practitioner

## 2023-06-27 VITALS — BP 126/84 | HR 69 | Wt 156.4 lb

## 2023-06-27 DIAGNOSIS — Z9109 Other allergy status, other than to drugs and biological substances: Secondary | ICD-10-CM

## 2023-06-27 DIAGNOSIS — Z87891 Personal history of nicotine dependence: Secondary | ICD-10-CM

## 2023-06-27 DIAGNOSIS — J439 Emphysema, unspecified: Secondary | ICD-10-CM

## 2023-06-27 DIAGNOSIS — J189 Pneumonia, unspecified organism: Secondary | ICD-10-CM | POA: Diagnosis not present

## 2023-06-27 MED ORDER — LEVOCETIRIZINE DIHYDROCHLORIDE 5 MG PO TABS
5.0000 mg | ORAL_TABLET | Freq: Every evening | ORAL | 6 refills | Status: DC
Start: 2023-06-27 — End: 2023-12-25

## 2023-06-27 NOTE — Progress Notes (Unsigned)
  Tollie Eth, DNP, AGNP-c [facility] Medicine [address] [place], [place] [address] [phone]   ACUTE VISIT- ESTABLISHED PATIENT  Blood pressure 126/84, pulse 69, weight 156 lb 6.4 oz (70.9 kg).  Subjective:  HPI Nicole Joyce is a 67 y.o. female presents to day for evaluation of acute concern(s).   She tells me that she quit smoking while she was sick and never picked them back up.   ROS negative except for what is listed in HPI. History, Medications, Surgery, SDOH, and Family History reviewed and updated as appropriate.  Objective:  Physical Exam      Assessment & Plan:   Problem List Items Addressed This Visit   None     Time: *** minutes, >50% spent counseling, care coordination, chart review, and documentation.   Tollie Eth, DNP, AGNP-c

## 2023-06-27 NOTE — Patient Instructions (Signed)
I am so proud of you!!!   Keep up the great work!!

## 2023-06-29 ENCOUNTER — Encounter: Payer: Self-pay | Admitting: Nurse Practitioner

## 2023-06-29 DIAGNOSIS — J189 Pneumonia, unspecified organism: Secondary | ICD-10-CM

## 2023-06-29 DIAGNOSIS — J439 Emphysema, unspecified: Secondary | ICD-10-CM | POA: Insufficient documentation

## 2023-06-29 HISTORY — DX: Pneumonia, unspecified organism: J18.9

## 2023-06-29 NOTE — Assessment & Plan Note (Signed)
Chronic seasonal and nonseasonal allergies reported by patient.  Recommend Xyzal for assistance with management.  Prescription provided.

## 2023-06-29 NOTE — Assessment & Plan Note (Signed)
Recent hospital visit with prolonged course of community-acquired pneumonia.  Fortunately she is doing much better at this time there are no alarm symptoms present today.  She does have a very minimal amount of rhonchus noted within the lung bases bilaterally.  This is an expected finding given that she has recently quit smoking her lungs are likely trying to heal.  She is no longer having fevers or shortness of breath which is reassuring.  Her appetite has increased and her weight is improving.  Given her recent finding of emphysema we will need to monitor her breathing patterns closely.  We can consider pulmonology referral. I will hold off on another x-ray at this time given the improvement she has shown. I have a low threshold for ordering this at any point in the near future if she has symptoms. We will continue to monitor.

## 2023-06-29 NOTE — Assessment & Plan Note (Signed)
New diagnosis with recent hospital visit. She is currently managed with albuterol only. I do feel that she would benefit from additional inhaler and would like to start triple therapy, but cost of inhalers is a concern. We will monitor closely. She would benefit from low dose lung CT for lung Ca screening.

## 2023-08-10 ENCOUNTER — Other Ambulatory Visit: Payer: Medicare PPO

## 2023-08-10 DIAGNOSIS — E559 Vitamin D deficiency, unspecified: Secondary | ICD-10-CM

## 2023-08-11 LAB — VITAMIN D 25 HYDROXY (VIT D DEFICIENCY, FRACTURES): Vit D, 25-Hydroxy: 33.6 ng/mL (ref 30.0–100.0)

## 2023-08-11 NOTE — Progress Notes (Signed)
Results sent through MyChart

## 2023-08-12 ENCOUNTER — Other Ambulatory Visit: Payer: Self-pay | Admitting: Medical

## 2023-08-12 DIAGNOSIS — E559 Vitamin D deficiency, unspecified: Secondary | ICD-10-CM

## 2023-08-12 DIAGNOSIS — Z9189 Other specified personal risk factors, not elsewhere classified: Secondary | ICD-10-CM

## 2023-08-12 MED ORDER — ATORVASTATIN CALCIUM 10 MG PO TABS
10.0000 mg | ORAL_TABLET | Freq: Every day | ORAL | 0 refills | Status: DC
Start: 2023-08-12 — End: 2023-11-16

## 2023-08-12 MED ORDER — VITAMIN D3 1.25 MG (50000 UT) PO TABS: 1.0000 | ORAL_TABLET | ORAL | 1 refills | Status: AC

## 2023-08-15 ENCOUNTER — Inpatient Hospital Stay: Admission: RE | Admit: 2023-08-15 | Payer: Medicare HMO | Source: Ambulatory Visit

## 2023-08-16 ENCOUNTER — Other Ambulatory Visit: Payer: Self-pay | Admitting: Nurse Practitioner

## 2023-08-16 DIAGNOSIS — Z13820 Encounter for screening for osteoporosis: Secondary | ICD-10-CM

## 2023-08-16 DIAGNOSIS — Z0000 Encounter for general adult medical examination without abnormal findings: Secondary | ICD-10-CM

## 2023-08-28 ENCOUNTER — Telehealth: Payer: Self-pay | Admitting: Nurse Practitioner

## 2023-08-28 DIAGNOSIS — J4520 Mild intermittent asthma, uncomplicated: Secondary | ICD-10-CM

## 2023-08-28 NOTE — Telephone Encounter (Signed)
PT Called and is requesting a refill on her trugility [place] Spaniel it worked good Please send to the [id number] - [place], [place] - [address]

## 2023-08-29 MED ORDER — TRELEGY ELLIPTA 200-62.5-25 MCG/ACT IN AEPB
INHALATION_SPRAY | RESPIRATORY_TRACT | 11 refills | Status: DC
Start: 2023-08-29 — End: 2023-12-28

## 2023-08-29 NOTE — Addendum Note (Signed)
Addended by: Haelyn Forgey, Huntley Dec E on: 08/29/2023 05:06 PM   Modules accepted: Orders

## 2023-09-01 ENCOUNTER — Other Ambulatory Visit: Payer: Self-pay | Admitting: Nurse Practitioner

## 2023-11-15 ENCOUNTER — Telehealth: Payer: Self-pay | Admitting: Nurse Practitioner

## 2023-11-15 NOTE — Telephone Encounter (Signed)
atorvastatin

## 2023-11-16 ENCOUNTER — Other Ambulatory Visit: Payer: Self-pay

## 2023-11-16 DIAGNOSIS — Z9189 Other specified personal risk factors, not elsewhere classified: Secondary | ICD-10-CM

## 2023-11-16 MED ORDER — ATORVASTATIN CALCIUM 10 MG PO TABS: 10.0000 mg | ORAL_TABLET | Freq: Every day | ORAL | 1 refills | Status: DC

## 2023-12-25 ENCOUNTER — Other Ambulatory Visit: Payer: Self-pay | Admitting: Medical

## 2023-12-25 ENCOUNTER — Encounter: Payer: Self-pay | Admitting: Internal Medicine

## 2023-12-25 ENCOUNTER — Other Ambulatory Visit: Payer: Self-pay | Admitting: Nurse Practitioner

## 2023-12-25 DIAGNOSIS — Z9189 Other specified personal risk factors, not elsewhere classified: Secondary | ICD-10-CM

## 2023-12-25 DIAGNOSIS — Z9109 Other allergy status, other than to drugs and biological substances: Secondary | ICD-10-CM

## 2023-12-28 ENCOUNTER — Ambulatory Visit: Payer: Medicare PPO | Admitting: Nurse Practitioner

## 2023-12-28 ENCOUNTER — Encounter: Payer: Self-pay | Admitting: Nurse Practitioner

## 2023-12-28 VITALS — BP 130/84 | HR 85 | Wt 189.4 lb

## 2023-12-28 DIAGNOSIS — R296 Repeated falls: Secondary | ICD-10-CM | POA: Diagnosis not present

## 2023-12-28 DIAGNOSIS — R635 Abnormal weight gain: Secondary | ICD-10-CM | POA: Diagnosis not present

## 2023-12-28 DIAGNOSIS — M62838 Other muscle spasm: Secondary | ICD-10-CM | POA: Diagnosis not present

## 2023-12-28 DIAGNOSIS — M5416 Radiculopathy, lumbar region: Secondary | ICD-10-CM | POA: Diagnosis not present

## 2023-12-28 HISTORY — DX: Repeated falls: R29.6

## 2023-12-28 MED ORDER — METHOCARBAMOL 750 MG PO TABS
750.0000 mg | ORAL_TABLET | Freq: Three times a day (TID) | ORAL | 1 refills | Status: DC | PRN
Start: 2023-12-28 — End: 2024-01-29

## 2023-12-28 MED ORDER — METHOCARBAMOL 750 MG PO TABS
750.0000 mg | ORAL_TABLET | Freq: Three times a day (TID) | ORAL | 1 refills | Status: DC | PRN
Start: 2023-12-28 — End: 2023-12-28

## 2023-12-28 MED ORDER — METHYLPREDNISOLONE SODIUM SUCC 125 MG IJ SOLR
125.0000 mg | Freq: Once | INTRAMUSCULAR | Status: AC
Start: 2023-12-28 — End: 2023-12-28
  Administered 2023-12-28: 125 mg via INTRAMUSCULAR

## 2023-12-28 MED ORDER — PREDNISONE 10 MG PO TABS
ORAL_TABLET | ORAL | 0 refills | Status: DC
Start: 2023-12-28 — End: 2024-01-29

## 2023-12-28 MED ORDER — PREDNISONE 10 MG PO TABS: ORAL_TABLET | ORAL | 0 refills | Status: DC

## 2023-12-28 NOTE — Assessment & Plan Note (Signed)
 She reports worsening low back pain since Christmas, with bilateral soreness in the lower back and buttocks, particularly in the mornings and when using the bathroom. The pain resembles muscle soreness and is exacerbated by sitting, pressure on the sacroiliac joint, and activities like coughing, sneezing, or bowel movements. She has a bulging disc at L3-L4, arthritis, and nerve impingement at L3, L4, and L5, potentially worsening over time. Frequent falls may be related to leg weakness and balance issues, possibly due to sciatic nerve involvement. An epidural injection in 2021 provided temporary relief. Weight gain since quitting smoking may contribute to symptoms. Pain improves with movement but worsens with sitting or pressure on the sacroiliac joint. No saddle symptoms or alarm symptoms present that warrant urgent evaluation today. She has been instructed to seek emergency care immediately if she begins to develop incontinence.  - Order MRI of the lumbar spine to assess for changes and potential nerve impingement. - Refer to orthopedics for further evaluation and management. - Administer a steroid injection into muscle for immediate relief. - Prescribe oral steroids (prednisone) to start the following day. - Prescribe Robaxin at bedtime to alleviate morning pain. - Instruct her to await a call regarding the MRI and orthopedic appointment.

## 2023-12-28 NOTE — Progress Notes (Signed)
 Shawna Clamp, DNP, AGNP-c [facility]  [address] [place], [place] [address] [phone]  ESTABLISHED PATIENT- Chronic Health and/or Follow-Up Visit  Blood pressure 130/84, pulse 85, weight 189 lb 6.4 oz (85.9 kg).    Nicole Joyce is a 68 y.o. year old female presenting today for evaluation and management of chronic conditions.   History of Present Illness Nicole Joyce, with a history of a bulging disc and arthritis, presents with worsening low back pain and frequent falls.  She has been experiencing severe low back pain that has been persistent and worsening since Christmas. The pain is located in the lower back, particularly on both sides, and is described as a soreness similar to over-exercised muscles. It is exacerbated by coughing, using the bathroom, and applying pressure to the area, but eases somewhat after a bowel movement. The pain is most intense in the mornings and when using the bathroom. She has a history of a bulging disc at L3-L4, arthritis, and narrowing at L4-L5 with pinched nerves on both sides, as seen in an MRI from 2021. She received joint injections which were helpful until recently.   She has been experiencing increased difficulty with balance and frequent falls, which she attributes to her legs feeling weak or giving out. She has fallen multiple times, including at work and in public places, resulting in bruises and requiring assistance to get up. Movement generally helps alleviate her symptoms, although she must be careful and deliberate with her movements to avoid exacerbating her pain or causing falls.  Her joints, particularly her hands, are sore and stiff, which she attributes to arthritis. She experiences difficulty moving her hands at times.  She has experienced changes in her bowel habits, noting that she now uses the bathroom daily, which she associates with stopping smoking. No bowel or bladder incontinence, but coughing can cause some  urinary leakage.   She has gained weight since quitting smoking, going from 157 to 188 pounds, and is attempting to lose weight through walking and low-impact exercises. 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 All ROS negative with exception of what is listed above.   PHYSICAL EXAM Physical Exam Vitals and nursing note reviewed.  Constitutional:      Appearance: Normal appearance.  Eyes:     Conjunctiva/sclera: Conjunctivae normal.  Musculoskeletal:     Lumbar back: Tenderness present. Decreased range of motion. Positive right straight leg raise test and positive left straight leg raise test.     Right lower leg: No edema.     Left lower leg: No edema.       Legs:     Comments: Pain with valsalva in the teal area.  Generalized pain in the yellow area.   Skin:    General: Skin is warm and dry.     Capillary Refill: Capillary refill takes 2 to 3 seconds.  Neurological:     Mental Status: She is alert and oriented to person, place, and time.     Sensory: No sensory deficit.     Motor: Weakness present.     Gait: Gait abnormal.  Psychiatric:        Mood and Affect: Mood normal.      PLAN Problem List Items Addressed This Visit     Bilateral lumbar radiculopathy - Primary   She reports worsening low back pain since Christmas, with bilateral soreness in the lower back and buttocks, particularly in the mornings and when using the bathroom. The  pain resembles muscle soreness and is exacerbated by sitting, pressure on the sacroiliac joint, and activities like coughing, sneezing, or bowel movements. She has a bulging disc at L3-L4, arthritis, and nerve impingement at L3, L4, and L5, potentially worsening over time. Frequent falls may be related to leg weakness and balance issues, possibly due to sciatic nerve involvement. An epidural injection in 2021 provided temporary relief. Weight gain since quitting smoking may contribute to  symptoms. Pain improves with movement but worsens with sitting or pressure on the sacroiliac joint. No saddle symptoms or alarm symptoms present that warrant urgent evaluation today. She has been instructed to seek emergency care immediately if she begins to develop incontinence.  - Order MRI of the lumbar spine to assess for changes and potential nerve impingement. - Refer to orthopedics for further evaluation and management. - Administer a steroid injection into muscle for immediate relief. - Prescribe oral steroids (prednisone) to start the following day. - Prescribe Robaxin at bedtime to alleviate morning pain. - Instruct her to await a call regarding the MRI and orthopedic appointment.      Relevant Medications   methocarbamol (ROBAXIN-750) 750 MG tablet   predniSONE (DELTASONE) 10 MG tablet   Other Relevant Orders   MR Lumbar Spine Wo Contrast   Ambulatory referral to Orthopedic Surgery   Muscle spasms of both lower extremities   Spasms in the buttocks noted on palpation. Suspect related to back pain. Robaxin sent for as needed use while waiting to see orthopedics.       Relevant Medications   methocarbamol (ROBAXIN-750) 750 MG tablet   predniSONE (DELTASONE) 10 MG tablet   Other Relevant Orders   MR Lumbar Spine Wo Contrast   Ambulatory referral to Orthopedic Surgery   Frequent falls   She reports worsening low back pain since Christmas, with bilateral soreness in the lower back and buttocks, particularly in the mornings and when using the bathroom. The pain resembles muscle soreness and is exacerbated by sitting, pressure on the sacroiliac joint, and activities like coughing, sneezing, or bowel movements. She has a bulging disc at L3-L4, arthritis, and nerve impingement at L3, L4, and L5, potentially worsening over time. Frequent falls may be related to leg weakness and balance issues, possibly due to sciatic nerve involvement. An epidural injection in 2021 provided temporary  relief. Weight gain since quitting smoking may contribute to symptoms. Pain improves with movement but worsens with sitting or pressure on the sacroiliac joint. No saddle symptoms or alarm symptoms present that warrant urgent evaluation today. She has been instructed to seek emergency care immediately if she begins to develop incontinence.  - Order MRI of the lumbar spine to assess for changes and potential nerve impingement. - Refer to orthopedics for further evaluation and management. - Administer a steroid injection into muscle for immediate relief. - Prescribe oral steroids (prednisone) to start the following day. - Prescribe Robaxin at bedtime to alleviate morning pain. - Instruct her to await a call regarding the MRI and orthopedic appointment.      Relevant Orders   MR Lumbar Spine Wo Contrast   Ambulatory referral to Orthopedic Surgery   Weight gain   She has gained weight from 157 to 188 pounds since quitting smoking in August. She is managing her weight through walking and low-impact exercises like Silver Sneakers. She is concerned about the impact of weight gain on her health and mobility, especially with her back pain and arthritis. - Encourage continued low-impact exercise, such as walking,  for weight management. - Monitor weight and adjust lifestyle recommendations as needed.       Return for TBD.  Shawna Clamp, DNP, AGNP-c

## 2023-12-28 NOTE — Patient Instructions (Signed)
 Start the prednisone tomorrow. Take this in the morning to prevent interruption with sleep.   Take the robaxin as you need for spasms. This may make you sleepy.   Listen for a call for the MRI and the Orthopedic doctor.   If you happen to loose control of your bowel or bladder, go to the emergency room immediately.

## 2023-12-28 NOTE — Assessment & Plan Note (Signed)
 She has gained weight from 157 to 188 pounds since quitting smoking in August. She is managing her weight through walking and low-impact exercises like Silver Sneakers. She is concerned about the impact of weight gain on her health and mobility, especially with her back pain and arthritis. - Encourage continued low-impact exercise, such as walking, for weight management. - Monitor weight and adjust lifestyle recommendations as needed.

## 2023-12-28 NOTE — Assessment & Plan Note (Signed)
 Spasms in the buttocks noted on palpation. Suspect related to back pain. Robaxin sent for as needed use while waiting to see orthopedics.

## 2024-01-03 ENCOUNTER — Other Ambulatory Visit: Payer: Self-pay | Admitting: Nurse Practitioner

## 2024-01-03 DIAGNOSIS — J4520 Mild intermittent asthma, uncomplicated: Secondary | ICD-10-CM

## 2024-01-03 NOTE — Telephone Encounter (Unsigned)
 Copied from CRM [id number]. Topic: Clinical - Medication Refill >> Jan 03, 2024  4:45 PM Turkey B wrote: Most Recent Primary Care Visit:  Provider: EARLY, SARA E  Department: [facility]  Visit Type: OFFICE VISIT  Date: 12/28/2023  Medication: albuterol (VENTOLIN HFA) 108 (90 Base) MCG/ACT inhaler  Has the patient contacted their pharmacy? no Pt thought she had to call in to practice  Is this the correct pharmacy for this prescription? yes  This is the patient's preferred pharmacy:  [facility]  [address] [place] [address]  [phone]   Has the prescription been filled recently? no  Is the patient out of the medication? yes  Has the patient been seen for an appointment in the last year OR does the patient have an upcoming appointment? yes  Can we respond through MyChart? yes  Agent: Please be advised that Rx refills may take up to 3 business days. We ask that you follow-up with your pharmacy.

## 2024-01-04 ENCOUNTER — Other Ambulatory Visit: Payer: Self-pay | Admitting: *Deleted

## 2024-01-04 ENCOUNTER — Telehealth: Payer: Self-pay | Admitting: *Deleted

## 2024-01-04 DIAGNOSIS — Z87891 Personal history of nicotine dependence: Secondary | ICD-10-CM

## 2024-01-04 DIAGNOSIS — Z122 Encounter for screening for malignant neoplasm of respiratory organs: Secondary | ICD-10-CM

## 2024-01-04 MED ORDER — ALBUTEROL SULFATE HFA 108 (90 BASE) MCG/ACT IN AERS: 2.0000 | INHALATION_SPRAY | Freq: Four times a day (QID) | RESPIRATORY_TRACT | 1 refills | Status: DC | PRN

## 2024-01-04 NOTE — Telephone Encounter (Signed)
 Lung Cancer Screening Narrative/Criteria Questionnaire (Cigarette Smokers Only- No Cigars/Pipes/vapes)   Nicole Joyce   SDMV:01/29/24 4:00- [name]                                           Jan 04, 1956              LDCT: 01/30/24 4:00- GI    68 y.o.   Phone: [phone]  Lung Screening Narrative (confirm age 37-77 yrs Medicare / 50-80 yrs Private pay insurance)   Insurance information:[employer]   Referring Provider:Early   This screening involves an initial phone call with a team member from our program. It is called a shared decision making visit. The initial meeting is required by insurance and Medicare to make sure you understand the program. This appointment takes about 15-20 minutes to complete. The CT scan will completed at a separate date/time. This scan takes about 5-10 minutes to complete and you may eat and drink before and after the scan.  Criteria questions for Lung Cancer Screening:   Are you a current or former smoker? Former Age began smoking: 15   If you are a former smoker, what year did you quit smoking? 2024 (within 15 yrs)   To calculate your smoking history, I need an accurate estimate of how many packs of cigarettes you smoked per day and for how many years. (Not just the number of PPD you are now smoking)   Years smoking 52 x Packs per day 1/2 = Pack years 26   (at least 20 pack yrs)   (Make sure they understand that we need to know how much they have smoked in the past, not just the number of PPD they are smoking now)  Do you have a personal history of cancer?  No    Do you have a family history of cancer? Yes  (cancer type and and relative) father (throat) GF (lung)  Are you coughing up blood?  No  Have you had unexplained weight loss of 15 lbs or more in the last 6 months? No  It looks like you meet all criteria.     Additional information: N/A

## 2024-01-05 ENCOUNTER — Ambulatory Visit (HOSPITAL_COMMUNITY)
Admission: RE | Admit: 2024-01-05 | Discharge: 2024-01-05 | Disposition: A | Discharge status: Home / Self Care | Source: Ambulatory Visit | Attending: Nurse Practitioner | Admitting: Nurse Practitioner

## 2024-01-05 DIAGNOSIS — M5116 Intervertebral disc disorders with radiculopathy, lumbar region: Secondary | ICD-10-CM | POA: Diagnosis not present

## 2024-01-05 DIAGNOSIS — R296 Repeated falls: Secondary | ICD-10-CM | POA: Diagnosis not present

## 2024-01-05 DIAGNOSIS — M62838 Other muscle spasm: Secondary | ICD-10-CM | POA: Diagnosis not present

## 2024-01-05 DIAGNOSIS — M5117 Intervertebral disc disorders with radiculopathy, lumbosacral region: Secondary | ICD-10-CM | POA: Diagnosis not present

## 2024-01-05 DIAGNOSIS — M5416 Radiculopathy, lumbar region: Secondary | ICD-10-CM | POA: Insufficient documentation

## 2024-01-05 DIAGNOSIS — M48061 Spinal stenosis, lumbar region without neurogenic claudication: Secondary | ICD-10-CM | POA: Diagnosis not present

## 2024-01-05 DIAGNOSIS — M4726 Other spondylosis with radiculopathy, lumbar region: Secondary | ICD-10-CM | POA: Diagnosis not present

## 2024-01-08 ENCOUNTER — Other Ambulatory Visit: Payer: Self-pay

## 2024-01-08 ENCOUNTER — Telehealth: Payer: Self-pay | Admitting: Nurse Practitioner

## 2024-01-08 DIAGNOSIS — Z9109 Other allergy status, other than to drugs and biological substances: Secondary | ICD-10-CM

## 2024-01-08 MED ORDER — LEVOCETIRIZINE DIHYDROCHLORIDE 5 MG PO TABS
5.0000 mg | ORAL_TABLET | Freq: Every evening | ORAL | 1 refills | Status: AC
Start: 2024-01-08 — End: ?

## 2024-01-08 NOTE — Telephone Encounter (Signed)
 [facility] fax  Levocetirizine 5 mg

## 2024-01-18 ENCOUNTER — Ambulatory Visit (INDEPENDENT_AMBULATORY_CARE_PROVIDER_SITE_OTHER): Admitting: Orthopedic Surgery

## 2024-01-18 ENCOUNTER — Other Ambulatory Visit (INDEPENDENT_AMBULATORY_CARE_PROVIDER_SITE_OTHER): Payer: Self-pay

## 2024-01-18 VITALS — BP 132/72 | HR 97 | Ht 64.0 in | Wt 190.0 lb

## 2024-01-18 DIAGNOSIS — M48062 Spinal stenosis, lumbar region with neurogenic claudication: Secondary | ICD-10-CM | POA: Diagnosis not present

## 2024-01-18 DIAGNOSIS — M545 Low back pain, unspecified: Secondary | ICD-10-CM

## 2024-01-18 MED ORDER — GABAPENTIN 300 MG PO CAPS: 300.0000 mg | ORAL_CAPSULE | Freq: Three times a day (TID) | ORAL | 2 refills | Status: AC

## 2024-01-18 NOTE — Progress Notes (Signed)
 Orthopedic Spine Surgery Office Note  Assessment: Patient is a 68 y.o. female with low back pain that radiates into bilateral buttock and left lateral thigh and leg. Her pain gets better if she flexes her lumbar spine. Has central stenosis at L3/4 and L4/5 consistent with neurogenic claudication   Plan: -Explained that initially conservative treatment is tried as a significant number of patients may experience relief with these treatment modalities. Discussed that the conservative treatments include:  -activity modification  -physical therapy  -over the counter pain medications  -medrol dosepak  -lumbar steroid injections -Patient has tried  tylenol, oral steroids, robaxin -Patient did well with an injection in the past. She said she got 80% relief for about a year. Looking back, it looks like Dr. Alvester Morin did left L4/5 and L5/S1 transforaminal injections so referred her for those same injections -Prescribed gabapentin for additional pain relief -Patient should return to office in 8 weeks, x-rays at next visit: none   Patient expressed understanding of the plan and all questions were answered to the patient's satisfaction.   ___________________________________________________________________________   History:  Patient is a 68 y.o. female who presents today for lumbar spine. Patient has had low back pain that radiates into her bilateral buttocks and left lateral thigh and leg for several years now. She said it has gotten worse recently. Sometimes the left leg goes out on her and she nearly falls. She had previously done an injection and felt that was very helpful. She got about 80% relief for a year and then pain gradually returned. There was no recent trauma or injury that caused worsening of her pain. She notes that her pain goes away if she sits down. She also says it better if she leans forward. She gets the pain only when standing or walking and it generally sets on after about 2-3  minutes of activity.    Weakness: denies Symptoms of imbalance: denies Paresthesias and numbness: denies Bowel or bladder incontinence: denies Saddle anesthesia: denies  Treatments tried: tylenol, oral steroids, robaxin  Review of systems: Denies fevers and chills, night sweats, unexplained weight loss, history of cancer. Has had pain that wakes her at night  Past medical history: Asthma HLD  Allergies: NKDA  Past surgical history:  Exploratory laparotomy Rotator cuff repair  Social history: Denies use of nicotine product (smoking, vaping, patches, smokeless) Alcohol use: rare Reports marijuana use, denies other recreational drug use   Physical Exam:  BMI of 32.6  General: no acute distress, appears stated age Neurologic: alert, answering questions appropriately, following commands Respiratory: unlabored breathing on room air, symmetric chest rise Psychiatric: appropriate affect, normal cadence to speech   MSK (spine):  -Strength exam      Left  Right EHL    5/5  5/5 TA    5/5  5/5 GSC    5/5  5/5 Knee extension  5/5  5/5 Hip flexion   5/5  5/5  -Sensory exam    Sensation intact to light touch in L2-S1 nerve distributions of bilateral lower extremities  -Achilles DTR: 2/4 on the left, 2/4 on the right -Patellar tendon DTR: 2/4 on the left, 2/4 on the right  -Straight leg raise: negative bilaterally -Femoral nerve stretch test: negative bilaterally -Clonus: no beats bilaterally  -Left hip exam: no pain through range of motion, negative stinchfield, negative faber -Right hip exam: no pain through range of motion, negative stinchfield, negative faber  Imaging: XRs of the lumbar spine from 01/18/2024 were independently reviewed and interpreted,  showing grade 1 spondylolisthesis at L3/4 and L4/5. Disc height loss at L4/5. No fracture or dislocation seen. Lordotic alignment.   MRI of the lumbar spine from 01/05/2024 was independently reviewed and  interpreted, showing central stenosis at L3/4 and L4/5. Spondylolisthesis seen at L3/4 and L4/5. Foraminal stenosis on the right at L3/4. Foraminal stenosis bilaterally at L4/5.    Patient name: Nicole Joyce Patient MRN: [id number] Date of visit: 01/18/24

## 2024-01-25 ENCOUNTER — Other Ambulatory Visit: Payer: Self-pay | Admitting: Nurse Practitioner

## 2024-01-25 DIAGNOSIS — J4520 Mild intermittent asthma, uncomplicated: Secondary | ICD-10-CM

## 2024-01-29 ENCOUNTER — Ambulatory Visit (INDEPENDENT_AMBULATORY_CARE_PROVIDER_SITE_OTHER): Admitting: Acute Care

## 2024-01-29 ENCOUNTER — Encounter: Payer: Self-pay | Admitting: Nurse Practitioner

## 2024-01-29 ENCOUNTER — Ambulatory Visit (INDEPENDENT_AMBULATORY_CARE_PROVIDER_SITE_OTHER): Payer: Medicare HMO | Admitting: Nurse Practitioner

## 2024-01-29 VITALS — BP 124/80 | HR 72 | Ht 64.5 in | Wt 192.2 lb

## 2024-01-29 DIAGNOSIS — E785 Hyperlipidemia, unspecified: Secondary | ICD-10-CM

## 2024-01-29 DIAGNOSIS — E559 Vitamin D deficiency, unspecified: Secondary | ICD-10-CM | POA: Diagnosis not present

## 2024-01-29 DIAGNOSIS — J4520 Mild intermittent asthma, uncomplicated: Secondary | ICD-10-CM

## 2024-01-29 DIAGNOSIS — M5441 Lumbago with sciatica, right side: Secondary | ICD-10-CM

## 2024-01-29 DIAGNOSIS — J439 Emphysema, unspecified: Secondary | ICD-10-CM

## 2024-01-29 DIAGNOSIS — R252 Cramp and spasm: Secondary | ICD-10-CM

## 2024-01-29 DIAGNOSIS — Z87828 Personal history of other (healed) physical injury and trauma: Secondary | ICD-10-CM | POA: Diagnosis not present

## 2024-01-29 DIAGNOSIS — Z9109 Other allergy status, other than to drugs and biological substances: Secondary | ICD-10-CM

## 2024-01-29 DIAGNOSIS — G8929 Other chronic pain: Secondary | ICD-10-CM

## 2024-01-29 DIAGNOSIS — Z6832 Body mass index (BMI) 32.0-32.9, adult: Secondary | ICD-10-CM

## 2024-01-29 DIAGNOSIS — Z87891 Personal history of nicotine dependence: Secondary | ICD-10-CM | POA: Diagnosis not present

## 2024-01-29 DIAGNOSIS — Z0000 Encounter for general adult medical examination without abnormal findings: Secondary | ICD-10-CM | POA: Diagnosis not present

## 2024-01-29 LAB — LIPID PANEL

## 2024-01-29 MED ORDER — TRELEGY ELLIPTA 200-62.5-25 MCG/ACT IN AEPB: INHALATION_SPRAY | RESPIRATORY_TRACT | Status: DC

## 2024-01-29 NOTE — Assessment & Plan Note (Signed)
 Chronic back pain significantly impacts daily activities, with pain primarily in the right buttock. Previous epidural in 2020 provided relief for two years. Current exacerbation has led to weight gain due to decreased mobility. She prefers epidural over surgery due to previous positive outcome and immediate relief. - Complete epidural injection next Thursday to alleviate back pain. - Monitor response to epidural and consider further interventions if necessary.

## 2024-01-29 NOTE — Patient Instructions (Signed)

## 2024-01-29 NOTE — Assessment & Plan Note (Signed)

## 2024-01-29 NOTE — Progress Notes (Addendum)
 Provider Attestation I agree with the documentation of the Shared Decision Making visit,  smoking cessation counseling if appropriate, and verification or eligibility for lung cancer screening as documented by the RN Nurse Navigator.   Raejean Bullock, MSN, AGACNP-BC [facility] See Amion for personal pager PCCM on call pager [phone]       Virtual Visit via Telephone Note  I connected with Jenifer Miu on 01/29/24 at  4:00 PM EDT by telephone and verified that I am speaking with the correct person using two identifiers.  Location: Patient: Nicole Joyce Provider: Emmalene Hare, RN   I discussed the limitations, risks, security and privacy concerns of performing an evaluation and management service by telephone and the availability of in person appointments. I also discussed with the patient that there may be a patient responsible charge related to this service. The patient expressed understanding and agreed to proceed.    Shared Decision Making Visit Lung Cancer Screening Program [phone])   Eligibility: Age 68 y.o. Pack Years Smoking History Calculation 26 (# packs/per year x # years smoked) Recent History of coughing up blood  no Unexplained weight loss? no ( >Than 15 pounds within the last 6 months ) Prior History Lung / other cancer no (Diagnosis within the last 5 years already requiring surveillance chest CT Scans). Smoking Status Former Smoker Former Smokers: Years since quit: < 1 year  Quit Date: 06/2023  Visit Components: Discussion included one or more decision making aids. yes Discussion included risk/benefits of screening. yes Discussion included potential follow up diagnostic testing for abnormal scans. yes Discussion included meaning and risk of over diagnosis. yes Discussion included meaning and risk of False Positives. yes Discussion included meaning of total radiation exposure. yes  Counseling Included: Importance of  adherence to annual lung cancer LDCT screening. yes Impact of comorbidities on ability to participate in the program. yes Ability and willingness to under diagnostic treatment. yes  Smoking Cessation Counseling: Current Smokers:  Discussed importance of smoking cessation. yes Information about tobacco cessation classes and interventions provided to patient. yes Patient provided with "ticket" for LDCT Scan. no Symptomatic Patient. no  Counseling(Intermediate counseling: > three minutes) [address]406 Diagnosis Code: Tobacco Use Z72.0 Asymptomatic Patient yes  Counseling (Intermediate counseling: > three minutes counseling) Q0347 Former Smokers:  Discussed the importance of maintaining cigarette abstinence. yes Diagnosis Code: Personal History of Nicotine Dependence. Q25.956 Information about tobacco cessation classes and interventions provided to patient. Yes Patient provided with "ticket" for LDCT Scan. no Written Order for Lung Cancer Screening with LDCT placed in Epic. Yes (CT Chest Lung Cancer Screening Low Dose W/O CM) [id number] Z12.2-Screening of respiratory organs Z87.891-Personal history of nicotine dependence   Alyse Bach, RN

## 2024-01-29 NOTE — Assessment & Plan Note (Signed)
 Asthma exacerbated by allergies and recent weight gain, with increased congestion and difficulty breathing, particularly during allergy season. Currently using albuterol and Xopenex inhalers. Smoking cessation may temporarily worsen symptoms as lungs heal. - Provide sample of long-acting inhaler (Trelegy) for use during allergy season. - Encourage continued smoking cessation to improve long-term respiratory health.

## 2024-01-29 NOTE — Assessment & Plan Note (Signed)
 Smoking cessation recently with increased breathing concerns noted during allergy season. Recommend Trelegy for use for next month to aid in breathing ease. Continue with smoking cessation.

## 2024-01-29 NOTE — Assessment & Plan Note (Signed)
 Lump on the left side of the neck present for four to five years, possibly related to previous injury causing a vessel tear per patient report. No current pain or significant symptoms, but occasional sensation of blockage when turning the head to the right. Blood flow appears normal on examination with pulsating flow visualized. She prefers monitoring over immediate intervention. - Monitor for any changes or worsening symptoms. - Consider ultrasound or vascular specialist referral if symptoms worsen.

## 2024-01-29 NOTE — Patient Instructions (Addendum)
 You look great today!!  I have sent the inhaler for you to use for 28 days. This is once a day to help heal the lungs and keep them from feeling worse due to the pollen. Rinse your mouth after using this.   I hope the epidural takes the pain away! I know you are anxious to have relief.   If the place on your neck gets worse or if you get worried about it, please let me know .

## 2024-01-29 NOTE — Progress Notes (Signed)
 Dell Fennel, DNP, AGNP-c [facility] [address] [place], [place] [address] Main Office [phone]  BP 124/80   Pulse 72   Ht 5' 4.5" (1.638 m)   Wt 192 lb 3.2 oz (87.2 kg)   BMI 32.48 kg/m    Subjective:    Patient ID: Nicole Joyce, female    DOB: 03-30-1956, 68 y.o.   MRN: [id number]  HPI: Nicole Joyce is a 68 y.o. female presenting on 01/29/2024 for comprehensive medical examination.   History of Present Illness Dion Parrow is a 68 year old female with asthma who presents for her annual exam with back pain and congestion.  She experiences persistent back pain primarily located in the buttocks, which has been ongoing for a significant period. The pain is severe, requiring her to sit frequently and limiting her ability to perform activities. It is exacerbated by cleaning and physical activity. She is scheduled for an epidural next Thursday, having previously received one in 2020 with good results, alleviating her pain for about two years. She has gained weight from 157 pounds in September to 192 pounds currently, attributing this to prednisone use and decreased physical activity due to pain. She attempts to exercise by going to the gym and walking but has to stop frequently due to pain.  She has been experiencing congestion for a few weeks, attributing her breathing difficulties to allergies and asthma. She uses an albuterol inhaler and Xopenex daily for asthma and congestion. Her breathing has worsened since she stopped smoking, but she understands this is part of the healing process. 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  She experiences headaches that have been persistent for about a week, which she attributes to sinus issues. The headaches are described as nagging rather than throbbing and are sometimes relieved by pressing on her eyeballs. She also reports sinus congestion and eye  watering, which worsened before recent rain. No chest pain or palpitations, but she reports heartburn. No changes in bowel or bladder habits. Reports swelling in the knee area, which she attributes to a pus pocket.  She reports a lump on the left side of her neck that has been present for four to five years. She recalls a fall that resulted in a hole in a vessel, which was initially considered for surgery but was later advised against. The lump sometimes causes a sensation of blockage when she turns her head, but it resolves when she moves her head back. Fatty tissue has developed around the area.  She has a history of asthma and takes cholesterol medication daily. She uses muscle relaxers as needed. She has stopped smoking, which she did for fifty years, and notes that her breathing has worsened since quitting but understands this is part of the healing process.  Pertinent items are noted in HPI.   Most Recent Depression Screen:     01/29/2024    2:10 PM 12/28/2023    1:31 PM 06/27/2023    4:12 PM 01/26/2023    1:59 PM 12/27/2022   12:02 PM  Depression screen PHQ 2/9  Decreased Interest 0 0 0 0 0  Down, Depressed, Hopeless 0 0 0 0 0  PHQ - 2 Score 0 0 0 0 0   Most Recent Anxiety Screen:      No data to display         Most Recent Fall Screen:    01/29/2024    2:09 PM 12/28/2023  1:30 PM 06/27/2023    4:12 PM 01/26/2023    1:59 PM 12/27/2022   12:01 PM  Fall Risk   Falls in the past year? 1 1 0 0 1  Comment     leg gave out  Number falls in past yr: 0 1 0 0 1  Injury with Fall? 1 1 0 0 0  Comment  scrapes and scratches,     Risk for fall due to : Impaired balance/gait Impaired balance/gait No Fall Risks No Fall Risks History of fall(s);Medication side effect  Follow up Falls evaluation completed Falls evaluation completed Falls evaluation completed Falls evaluation completed Falls prevention discussed;Education provided;Falls evaluation completed    Past medical history,  surgical history, medications, allergies, family history and social history reviewed with patient today and changes made to appropriate areas of the chart.  Past Medical History:  Past Medical History:  Diagnosis Date   Abnormal lung sounds 11/18/2020   Anemia    Asthma    Bilateral arm pain 02/03/2020   Community acquired pneumonia of right lower lobe of lung 06/29/2023   Dysmenorrhea    Elevated lipids    Endometriosis    Estrogen deficiency 11/18/2020   Fall 01/05/2016   Frequent falls 12/28/2023   History of endometriosis 11/18/2020   History of shingles 02/03/2020   Hormone disorder    Infertility, female    Need for COVID-19 vaccine 12/02/2021   Nocturnal muscle cramps 01/26/2023   PID (pelvic inflammatory disease)    Smoker 02/03/2020   Medications:  Current Outpatient Medications on File Prior to Visit  Medication Sig   albuterol (VENTOLIN HFA) 108 (90 Base) MCG/ACT inhaler TAKE 2 PUFFS BY MOUTH EVERY 6 HOURS AS NEEDED FOR WHEEZE OR SHORTNESS OF BREATH   atorvastatin (LIPITOR) 10 MG tablet TAKE 1 TABLET BY MOUTH EVERY DAY   Cholecalciferol (VITAMIN D3) 1.25 MG (50000 UT) TABS Take 1 tablet by mouth once a week.   gabapentin (NEURONTIN) 300 MG capsule Take 1 capsule (300 mg total) by mouth 3 (three) times daily.   levocetirizine (XYZAL) 5 MG tablet Take 1 tablet (5 mg total) by mouth every evening. For allergies and congestion.   No current facility-administered medications on file prior to visit.   Surgical History:  Past Surgical History:  Procedure Laterality Date   ABDOMINAL SURGERY     exploratory laparotomy with treatment of endometriosis   PELVIC LAPAROSCOPY     ROTATOR CUFF REPAIR     Allergies:  No Known Allergies Family History:  Family History  Problem Relation Age of Onset   COPD Mother    Throat cancer Father    Hypertension Sister    COPD Sister    Hypertension Brother    COPD Brother    Lung cancer Paternal Grandfather    Hypertension  Sister    Hypertension Sister        Objective:    BP 124/80   Pulse 72   Ht 5' 4.5" (1.638 m)   Wt 192 lb 3.2 oz (87.2 kg)   BMI 32.48 kg/m   Wt Readings from Last 3 Encounters:  01/29/24 192 lb 3.2 oz (87.2 kg)  01/18/24 190 lb (86.2 kg)  12/28/23 189 lb 6.4 oz (85.9 kg)    Physical Exam Vitals and nursing note reviewed.  Constitutional:      Appearance: Normal appearance.  HENT:     Head: Normocephalic and atraumatic.     Right Ear: Tympanic membrane normal.  Left Ear: Tympanic membrane normal.     Nose: Congestion present.     Mouth/Throat:     Pharynx: Posterior oropharyngeal erythema present.  Eyes:     Conjunctiva/sclera: Conjunctivae normal.  Cardiovascular:     Rate and Rhythm: Normal rate and regular rhythm.     Pulses: Normal pulses.     Heart sounds: Normal heart sounds.  Pulmonary:     Effort: Pulmonary effort is normal.     Breath sounds: Wheezing and rhonchi present.  Abdominal:     General: Bowel sounds are normal. There is no distension.     Palpations: Abdomen is soft.     Tenderness: There is no abdominal tenderness. There is no right CVA tenderness, left CVA tenderness or guarding.  Musculoskeletal:        General: Normal range of motion.     Cervical back: No tenderness.     Right lower leg: No edema.     Left lower leg: No edema.  Lymphadenopathy:     Cervical: No cervical adenopathy.  Skin:    General: Skin is warm and dry.     Capillary Refill: Capillary refill takes less than 2 seconds.  Neurological:     General: No focal deficit present.     Mental Status: She is alert and oriented to person, place, and time.     Motor: No weakness.  Psychiatric:        Mood and Affect: Mood normal.        Behavior: Behavior normal.      Results for orders placed or performed in visit on 08/10/23  Vitamin D, 25-hydroxy   Collection Time: 08/10/23  2:51 PM  Result Value Ref Range   Vit D, 25-Hydroxy 33.6 30.0 - 100.0 ng/mL        Assessment & Plan:   Problem List Items Addressed This Visit     Chronic right-sided low back pain with right-sided sciatica   Chronic back pain significantly impacts daily activities, with pain primarily in the right buttock. Previous epidural in 2020 provided relief for two years. Current exacerbation has led to weight gain due to decreased mobility. She prefers epidural over surgery due to previous positive outcome and immediate relief. - Complete epidural injection next Thursday to alleviate back pain. - Monitor response to epidural and consider further interventions if necessary.      Mild intermittent asthma without complication   Asthma exacerbated by allergies and recent weight gain, with increased congestion and difficulty breathing, particularly during allergy season. Currently using albuterol and Xopenex inhalers. Smoking cessation may temporarily worsen symptoms as lungs heal. - Provide sample of long-acting inhaler (Trelegy) for use during allergy season. - Encourage continued smoking cessation to improve long-term respiratory health.      Relevant Medications   Fluticasone-Umeclidin-Vilant (TRELEGY ELLIPTA) 200-62.5-25 MCG/ACT AEPB   Encounter for annual physical exam - Primary   CPE completed today. Review of HM activities and recommendations discussed and provided on AVS. Anticipatory guidance, diet, and exercise recommendations provided. Medications, allergies, and hx reviewed and updated as necessary. Orders placed as listed below.  Plan: - Labs ordered. Will make changes as necessary based on results.  - I will review these results and send recommendations via MyChart or a telephone call.  - F/U with CPE in 1 year or sooner for acute/chronic health needs as directed.        Environmental allergies   Persistent sinus congestion and headache for over a week, likely allergy-related. Symptoms  include nasal congestion, runny eyes, and headache. Some relief with sinus  medication. Consider antibiotic if symptoms persist due to potential sinus infection. - Consider antibiotic if symptoms persist. - Recommend continued use of sinus medication.      Relevant Medications   Fluticasone-Umeclidin-Vilant (TRELEGY ELLIPTA) 200-62.5-25 MCG/ACT AEPB   Pulmonary emphysema determined by X-ray [facility])   Smoking cessation recently with increased breathing concerns noted during allergy season. Recommend Trelegy for use for next month to aid in breathing ease. Continue with smoking cessation.       Relevant Medications   Fluticasone-Umeclidin-Vilant (TRELEGY ELLIPTA) 200-62.5-25 MCG/ACT AEPB   Remote neck injury   Lump on the left side of the neck present for four to five years, possibly related to previous injury causing a vessel tear per patient report. No current pain or significant symptoms, but occasional sensation of blockage when turning the head to the right. Blood flow appears normal on examination with pulsating flow visualized. She prefers monitoring over immediate intervention. - Monitor for any changes or worsening symptoms. - Consider ultrasound or vascular specialist referral if symptoms worsen.      Vitamin D deficiency   Relevant Orders   VITAMIN D 25 Hydroxy (Vit-D Deficiency, Fractures)   Elevated lipids   Relevant Orders   Lipid panel   Other Visit Diagnoses       Bilateral leg cramps       Relevant Orders   CBC with Differential/Platelet   CMP14+EGFR   Magnesium     BMI 32.0-32.9,adult       Relevant Orders   CBC with Differential/Platelet   CMP14+EGFR   Hemoglobin A1c   Lipid panel         Follow up plan: Return in about 1 year (around 01/28/2025) for CPE.  NEXT PREVENTATIVE PHYSICAL DUE IN 1 YEAR.  PATIENT COUNSELING PROVIDED FOR ALL ADULT PATIENTS: A well balanced diet low in saturated fats, cholesterol, and moderation in carbohydrates.  This can be as simple as monitoring portion sizes and cutting back on sugary beverages  such as soda and juice to start with.    Daily water consumption of at least 64 ounces.  Physical activity at least 180 minutes per week.  If just starting out, start 10 minutes a day and work your way up.   This can be as simple as taking the stairs instead of the elevator and walking 2-3 laps around the office  purposefully every day.   STD protection, partner selection, and regular testing if high risk.  Limited consumption of alcoholic beverages if alcohol is consumed. For men, I recommend no more than 14 alcoholic beverages per week, spread out throughout the week (max 2 per day). Avoid "binge" drinking or consuming large quantities of alcohol in one setting.  Please let me know if you feel you may need help with reduction or quitting alcohol consumption.   Avoidance of nicotine, if used. Please let me know if you feel you may need help with reduction or quitting nicotine use.   Daily mental health attention. This can be in the form of 5 minute daily meditation, prayer, journaling, yoga, reflection, etc.  Purposeful attention to your emotions and mental state can significantly improve your overall wellbeing  and  Health.  Please know that I am here to help you with all of your health care goals and am happy to work with you to find a solution that works best for you.  The greatest advice I have received with any  changes in life are to take it one step at a time, that even means if all you can focus on is the next 60 seconds, then do that and celebrate your victories.  With any changes in life, you will have set backs, and that is OK. The important thing to remember is, if you have a set back, it is not a failure, it is an opportunity to try again! Screening Testing Mammogram Every 1 -2 years based on history and risk factors Starting at age 47 Pap Smear Ages 21-39 every 3 years Ages 50-65 every 5 years with HPV testing More frequent testing may be required based on results and  history Colon Cancer Screening Every 1-10 years based on test performed, risk factors, and history Starting at age 33 Bone Density Screening Every 2-10 years based on history Starting at age 54 for women Recommendations for men differ based on medication usage, history, and risk factors AAA Screening One time ultrasound Men 18-55 years old who have every smoked Lung Cancer Screening Low Dose Lung CT every 12 months Age 70-80 years with a 30 pack-year smoking history who still smoke or who have quit within the last 15 years   Screening Labs Routine  Labs: Complete Blood Count (CBC), Complete Metabolic Panel (CMP), Cholesterol (Lipid Panel) Every 6-12 months based on history and medications May be recommended more frequently based on current conditions or previous results Hemoglobin A1c Lab Every 3-12 months based on history and previous results Starting at age 63 or earlier with diagnosis of diabetes, high cholesterol, BMI >26, and/or risk factors Frequent monitoring for patients with diabetes to ensure blood sugar control Thyroid Panel (TSH) Every 6 months based on history, symptoms, and risk factors May be repeated more often if on medication HIV One time testing for all patients 63 and older May be repeated more frequently for patients with increased risk factors or exposure Hepatitis C One time testing for all patients 62 and older May be repeated more frequently for patients with increased risk factors or exposure Gonorrhea, Chlamydia Every 12 months for all sexually active persons 13-24 years Additional monitoring may be recommended for those who are considered high risk or who have symptoms Every 12 months for any woman on birth control, regardless of sexual activity PSA Men 39-49 years old with risk factors Additional screening may be recommended from age 63-69 based on risk factors, symptoms, and history  Vaccine Recommendations Tetanus Booster All adults every 10  years Flu Vaccine All patients 6 months and older every year COVID Vaccine All patients 12 years and older Initial dosing with booster May recommend additional booster based on age and health history HPV Vaccine 2 doses all patients age 63-26 Dosing may be considered for patients over 26 Shingles Vaccine (Shingrix) 2 doses all adults 55 years and older Pneumonia (Pneumovax 75) All adults 65 years and older May recommend earlier dosing based on health history One year apart from Prevnar 75 Pneumonia (Prevnar 25) All adults 65 years and older Dosed 1 year after Pneumovax 23 Pneumonia (Prevnar 20) One time alternative to the two dosing of 13 and 23 For all adults with initial dose of 23, 20 is recommended 1 year later For all adults with initial dose of 13, 23 is still recommended as second option 1 year later

## 2024-01-29 NOTE — Assessment & Plan Note (Signed)
 Persistent sinus congestion and headache for over a week, likely allergy-related. Symptoms include nasal congestion, runny eyes, and headache. Some relief with sinus medication. Consider antibiotic if symptoms persist due to potential sinus infection. - Consider antibiotic if symptoms persist. - Recommend continued use of sinus medication.

## 2024-01-30 ENCOUNTER — Ambulatory Visit
Admission: RE | Admit: 2024-01-30 | Discharge: 2024-01-30 | Disposition: A | Discharge status: Home / Self Care | Source: Ambulatory Visit | Attending: Acute Care | Admitting: Acute Care

## 2024-01-30 DIAGNOSIS — Z87891 Personal history of nicotine dependence: Secondary | ICD-10-CM

## 2024-01-30 DIAGNOSIS — Z122 Encounter for screening for malignant neoplasm of respiratory organs: Secondary | ICD-10-CM | POA: Diagnosis not present

## 2024-01-30 LAB — CBC WITH DIFFERENTIAL/PLATELET
Basophils Absolute: 0 10*3/uL (ref 0.0–0.2)
Basos: 0 %
EOS (ABSOLUTE): 0 10*3/uL (ref 0.0–0.4)
Eos: 0 %
Hematocrit: 41.2 % (ref 34.0–46.6)
Hemoglobin: 13.3 g/dL (ref 11.1–15.9)
Immature Grans (Abs): 0 10*3/uL (ref 0.0–0.1)
Immature Granulocytes: 0 %
Lymphocytes Absolute: 3.8 10*3/uL — ABNORMAL HIGH (ref 0.7–3.1)
Lymphs: 49 %
MCH: 28.6 pg (ref 26.6–33.0)
MCHC: 32.3 g/dL (ref 31.5–35.7)
MCV: 89 fL (ref 79–97)
Monocytes Absolute: 0.4 10*3/uL (ref 0.1–0.9)
Monocytes: 5 %
Neutrophils Absolute: 3.7 10*3/uL (ref 1.4–7.0)
Neutrophils: 46 %
Platelets: 314 10*3/uL (ref 150–450)
RBC: 4.65 x10E6/uL (ref 3.77–5.28)
RDW: 14.5 % (ref 11.7–15.4)
WBC: 8 10*3/uL (ref 3.4–10.8)

## 2024-01-30 LAB — LIPID PANEL
Cholesterol, Total: 215 mg/dL — ABNORMAL HIGH (ref 100–199)
HDL: 112 mg/dL (ref >39)
LDL CALC COMMENT:: 1.9 ratio (ref 0.0–4.4)
LDL Chol Calc (NIH): 70 mg/dL (ref 0–99)
Triglycerides: 210 mg/dL — ABNORMAL HIGH (ref 0–149)
VLDL Cholesterol Cal: 33 mg/dL (ref 5–40)

## 2024-01-30 LAB — CMP14+EGFR
ALT: 28 IU/L (ref 0–32)
AST: 20 IU/L (ref 0–40)
Albumin: 4.3 g/dL (ref 3.9–4.9)
Alkaline Phosphatase: 91 IU/L (ref 44–121)
BUN/Creatinine Ratio: 14 (ref 12–28)
BUN: 13 mg/dL (ref 8–27)
Bilirubin Total: 0.6 mg/dL (ref 0.0–1.2)
CO2: 23 mmol/L (ref 20–29)
Calcium: 9.9 mg/dL (ref 8.7–10.3)
Chloride: 100 mmol/L (ref 96–106)
Creatinine, Ser: 0.92 mg/dL (ref 0.57–1.00)
Globulin, Total: 2.6 g/dL (ref 1.5–4.5)
Glucose: 107 mg/dL — ABNORMAL HIGH (ref 70–99)
Potassium: 3.8 mmol/L (ref 3.5–5.2)
Sodium: 139 mmol/L (ref 134–144)
Total Protein: 6.9 g/dL (ref 6.0–8.5)
eGFR: 68 mL/min/{1.73_m2} (ref >59)

## 2024-01-30 LAB — MAGNESIUM: Magnesium: 2.1 mg/dL (ref 1.6–2.3)

## 2024-01-30 LAB — HEMOGLOBIN A1C
Est. average glucose Bld gHb Est-mCnc: 114 mg/dL
Hgb A1c MFr Bld: 5.6 % (ref 4.8–5.6)

## 2024-01-30 LAB — VITAMIN D 25 HYDROXY (VIT D DEFICIENCY, FRACTURES): Vit D, 25-Hydroxy: 40.6 ng/mL (ref 30.0–100.0)

## 2024-02-01 ENCOUNTER — Encounter: Payer: Self-pay | Admitting: Nurse Practitioner

## 2024-02-08 ENCOUNTER — Other Ambulatory Visit: Payer: Self-pay

## 2024-02-08 ENCOUNTER — Ambulatory Visit (INDEPENDENT_AMBULATORY_CARE_PROVIDER_SITE_OTHER): Admitting: Physical Medicine and Rehabilitation

## 2024-02-08 VITALS — BP 170/105 | HR 67

## 2024-02-08 DIAGNOSIS — M5416 Radiculopathy, lumbar region: Secondary | ICD-10-CM | POA: Diagnosis not present

## 2024-02-08 MED ORDER — METHYLPREDNISOLONE ACETATE 40 MG/ML IJ SUSP
40.0000 mg | Freq: Once | INTRAMUSCULAR | Status: AC
  Administered 2024-02-08: 40 mg

## 2024-02-08 NOTE — Patient Instructions (Signed)

## 2024-02-08 NOTE — Progress Notes (Signed)
 Pain Scale   Average Pain 8 Patient advising her pain in her Lower back radiates to her right leg ,Pain is constant when standing for log periods of time. Patient advises she does get some relief when sitting.        +Driver, -BT, -Dye Allergies.

## 2024-02-13 NOTE — Procedures (Signed)
 Lumbosacral Transforaminal Epidural Steroid Injection - Sub-Pedicular Approach with Fluoroscopic Guidance  Patient: Nicole Joyce      Date of Birth: 31-May-1956 MRN: [id number] PCP: Annella Kief, NP      Visit Date: 02/08/2024   Universal Protocol:    Date/Time: 02/08/2024  Consent Given By: the patient  Position: PRONE  Additional Comments: Vital signs were monitored before and after the procedure. Patient was prepped and draped in the usual sterile fashion. The correct patient, procedure, and site was verified.   Injection Procedure Details:   Procedure diagnoses: Lumbar radiculopathy [M54.16]    Meds Administered:  Meds ordered this encounter  Medications   methylPREDNISolone  acetate (DEPO-MEDROL ) injection 40 mg    Laterality: Left  Location/Site: L4 and L5  Needle:5.0 in., 22 ga.  Short bevel or Quincke spinal needle  Needle Placement: Transforaminal  Findings:    -Comments: Excellent flow of contrast along the nerve, nerve root and into the epidural space.  Procedure Details: After squaring off the end-plates to get a true AP view, the C-arm was positioned so that an oblique view of the foramen as noted above was visualized. The target area is just inferior to the "nose of the scotty dog" or sub pedicular. The soft tissues overlying this structure were infiltrated with 2-3 ml. of 1% Lidocaine  without Epinephrine.  The spinal needle was inserted toward the target using a "trajectory" view along the fluoroscope beam.  Under AP and lateral visualization, the needle was advanced so it did not puncture dura and was located close the 6 O'Clock position of the pedical in AP tracterory. Biplanar projections were used to confirm position. Aspiration was confirmed to be negative for CSF and/or blood. A 1-2 ml. volume of Isovue-250 was injected and flow of contrast was noted at each level. Radiographs were obtained for documentation purposes.   After attaining the desired  flow of contrast documented above, a 0.5 to 1.0 ml test dose of 0.25% Marcaine was injected into each respective transforaminal space.  The patient was observed for 90 seconds post injection.  After no sensory deficits were reported, and normal lower extremity motor function was noted,   the above injectate was administered so that equal amounts of the injectate were placed at each foramen (level) into the transforaminal epidural space.   Additional Comments:  The patient tolerated the procedure well Dressing: 2 x 2 sterile gauze and Band-Aid    Post-procedure details: Patient was observed during the procedure. Post-procedure instructions were reviewed.  Patient left the clinic in stable condition.

## 2024-02-13 NOTE — Progress Notes (Signed)
 Nicole Joyce - 68 y.o. female MRN [id number]  Date of birth: [birth year]  Office Visit Note: Visit Date: 02/08/2024 PCP: Annella Kief, NP Referred by: Diedra Fowler, MD  Subjective: Chief Complaint  Patient presents with   Lower Back - Pain   HPI:  [name] is a 68 y.o. female who comes in today at the request of Dr. Colette Davies for planned Left L4-5 and L5-S1 Lumbar Transforaminal epidural steroid injection with fluoroscopic guidance.  The patient has failed conservative care including home exercise, medications, time and activity modification.  This injection will be diagnostic and hopefully therapeutic.  Please see requesting physician notes for further details and justification.   ROS Otherwise per HPI.  Assessment & Plan: Visit Diagnoses:    ICD-10-CM   1. Lumbar radiculopathy  M54.16 XR C-ARM NO REPORT    Epidural Steroid injection    methylPREDNISolone  acetate (DEPO-MEDROL ) injection 40 mg      Plan: No additional findings.   Meds & Orders:  Meds ordered this encounter  Medications   methylPREDNISolone  acetate (DEPO-MEDROL ) injection 40 mg    Orders Placed This Encounter  Procedures   XR C-ARM NO REPORT   Epidural Steroid injection    Follow-up: Return for visit to requesting provider as needed.   Procedures: No procedures performed  Lumbosacral Transforaminal Epidural Steroid Injection - Sub-Pedicular Approach with Fluoroscopic Guidance  Patient: Nicole Joyce      Date of Birth: 10-03-1956 MRN: [id number] PCP: Annella Kief, NP      Visit Date: 02/08/2024   Universal Protocol:    Date/Time: 02/08/2024  Consent Given By: the patient  Position: PRONE  Additional Comments: Vital signs were monitored before and after the procedure. Patient was prepped and draped in the usual sterile fashion. The correct patient, procedure, and site was verified.   Injection Procedure Details:   Procedure diagnoses: Lumbar radiculopathy [M54.16]    Meds  Administered:  Meds ordered this encounter  Medications   methylPREDNISolone  acetate (DEPO-MEDROL ) injection 40 mg    Laterality: Left  Location/Site: L4 and L5  Needle:5.0 in., 22 ga.  Short bevel or Quincke spinal needle  Needle Placement: Transforaminal  Findings:    -Comments: Excellent flow of contrast along the nerve, nerve root and into the epidural space.  Procedure Details: After squaring off the end-plates to get a true AP view, the C-arm was positioned so that an oblique view of the foramen as noted above was visualized. The target area is just inferior to the "nose of the scotty dog" or sub pedicular. The soft tissues overlying this structure were infiltrated with 2-3 ml. of 1% Lidocaine  without Epinephrine.  The spinal needle was inserted toward the target using a "trajectory" view along the fluoroscope beam.  Under AP and lateral visualization, the needle was advanced so it did not puncture dura and was located close the 6 O'Clock position of the pedical in AP tracterory. Biplanar projections were used to confirm position. Aspiration was confirmed to be negative for CSF and/or blood. A 1-2 ml. volume of Isovue-250 was injected and flow of contrast was noted at each level. Radiographs were obtained for documentation purposes.   After attaining the desired flow of contrast documented above, a 0.5 to 1.0 ml test dose of 0.25% Marcaine was injected into each respective transforaminal space.  The patient was observed for 90 seconds post injection.  After no sensory deficits were reported, and normal lower extremity motor function was noted,   the above  injectate was administered so that equal amounts of the injectate were placed at each foramen (level) into the transforaminal epidural space.   Additional Comments:  The patient tolerated the procedure well Dressing: 2 x 2 sterile gauze and Band-Aid    Post-procedure details: Patient was observed during the  procedure. Post-procedure instructions were reviewed.  Patient left the clinic in stable condition.    Clinical History: MRI LUMBAR SPINE WITHOUT CONTRAST   TECHNIQUE: Multiplanar, multisequence MR imaging of the lumbar spine was performed. No intravenous contrast was administered.   COMPARISON:  MRI of the lumbar spine dated 03/05/2020   FINDINGS: Segmentation: Standard.   Alignment:  Grade 1 anterolisthesis of L3 on L4 and L4 on L5.   Vertebrae: Vertebral bodies demonstrate normal signal intensity. No compression fractures. Congenital narrowing of the canal and prominent epidural fat contributes to canal stenoses throughout the lumbar spine, progressed since prior examination in 2021.   Conus medullaris and cauda equina: The conus medullaris terminates at the level of L1-L2. The distal spinal cord signal intensity is normal.   Paraspinal and other soft tissues: The visualized abdomen and pelvis show no soft tissue abnormality. The visualized aorta is normal.   Disc levels:   L1-L2: Disc is normal in configuration. Moderate bilateral facet arthropathy. No neuroforaminal stenosis. No spinal canal stenosis.   L2-L3: Disc bulge. Severe bilateral facet arthropathy. Moderate bilateral neuroforaminal stenosis. Moderate spinal canal stenosis.   L3-L4: Disc bulge. Severe bilateral facet arthropathy. Severe bilateral neuroforaminal stenosis. Moderate spinal canal stenosis.   L4-L5: Disc bulge. Severe bilateral facet arthropathy. Moderate right and mild left neuroforaminal stenosis. Severe spinal canal stenosis.   L5-S1: Disc bulge. Moderate bilateral facet arthropathy. No neuroforaminal stenosis. No spinal canal stenosis.   IMPRESSION: 1. Moderate to severe canal stenosis at L2-L3, L3-L4, and L4-L5 secondary to disc bulging and facet arthropathy superimposed on congenital narrowing of the cranial and prominent epidural fat. The degree of epidural fat has increased since  prior examination in 2021. 2. Moderate and severe foraminal stenosis at L2-L3, L3-L4, and L4-L5.     Electronically Signed   By: Johnanna Mylar M.D.   On: 01/29/2024 10:39     Objective:  VS:  HT:    WT:   BMI:     BP:(!) 170/105  HR:67bpm  TEMP: ( )  RESP:  Physical Exam Vitals and nursing note reviewed.  Constitutional:      General: She is not in acute distress.    Appearance: Normal appearance. She is not ill-appearing.  HENT:     Head: Normocephalic and atraumatic.     Right Ear: External ear normal.     Left Ear: External ear normal.  Eyes:     Extraocular Movements: Extraocular movements intact.  Cardiovascular:     Rate and Rhythm: Normal rate.     Pulses: Normal pulses.  Pulmonary:     Effort: Pulmonary effort is normal. No respiratory distress.  Abdominal:     General: There is no distension.     Palpations: Abdomen is soft.  Musculoskeletal:        General: Tenderness present.     Cervical back: Neck supple.     Right lower leg: No edema.     Left lower leg: No edema.     Comments: Patient has good distal strength with no pain over the greater trochanters.  No clonus or focal weakness.  Skin:    Findings: No erythema, lesion or rash.  Neurological:  General: No focal deficit present.     Mental Status: She is alert and oriented to person, place, and time.     Sensory: No sensory deficit.     Motor: No weakness or abnormal muscle tone.     Coordination: Coordination normal.  Psychiatric:        Mood and Affect: Mood normal.        Behavior: Behavior normal.      Imaging: No results found.

## 2024-02-22 ENCOUNTER — Other Ambulatory Visit: Payer: Self-pay

## 2024-02-22 ENCOUNTER — Encounter (HOSPITAL_COMMUNITY): Payer: Self-pay

## 2024-02-22 ENCOUNTER — Emergency Department (HOSPITAL_COMMUNITY)

## 2024-02-22 ENCOUNTER — Emergency Department (HOSPITAL_COMMUNITY)
Admission: EM | Admit: 2024-02-22 | Discharge: 2024-02-22 | Disposition: A | Discharge status: Home / Self Care | Attending: Emergency Medicine | Admitting: Emergency Medicine

## 2024-02-22 ENCOUNTER — Ambulatory Visit (HOSPITAL_COMMUNITY)
Admission: EM | Admit: 2024-02-22 | Discharge: 2024-02-22 | Disposition: A | Discharge status: Home / Self Care | Attending: Internal Medicine | Admitting: Internal Medicine

## 2024-02-22 ENCOUNTER — Ambulatory Visit (INDEPENDENT_AMBULATORY_CARE_PROVIDER_SITE_OTHER)

## 2024-02-22 ENCOUNTER — Ambulatory Visit: Payer: Self-pay

## 2024-02-22 DIAGNOSIS — R519 Headache, unspecified: Secondary | ICD-10-CM | POA: Insufficient documentation

## 2024-02-22 DIAGNOSIS — R630 Anorexia: Secondary | ICD-10-CM | POA: Diagnosis not present

## 2024-02-22 DIAGNOSIS — R509 Fever, unspecified: Secondary | ICD-10-CM | POA: Diagnosis not present

## 2024-02-22 DIAGNOSIS — R062 Wheezing: Secondary | ICD-10-CM | POA: Diagnosis not present

## 2024-02-22 DIAGNOSIS — R531 Weakness: Secondary | ICD-10-CM

## 2024-02-22 DIAGNOSIS — R000 Tachycardia, unspecified: Secondary | ICD-10-CM | POA: Insufficient documentation

## 2024-02-22 DIAGNOSIS — I9589 Other hypotension: Secondary | ICD-10-CM

## 2024-02-22 DIAGNOSIS — R051 Acute cough: Secondary | ICD-10-CM

## 2024-02-22 DIAGNOSIS — R110 Nausea: Secondary | ICD-10-CM | POA: Insufficient documentation

## 2024-02-22 DIAGNOSIS — I700 Atherosclerosis of aorta: Secondary | ICD-10-CM | POA: Diagnosis not present

## 2024-02-22 DIAGNOSIS — I771 Stricture of artery: Secondary | ICD-10-CM | POA: Diagnosis not present

## 2024-02-22 LAB — URINALYSIS, ROUTINE W REFLEX MICROSCOPIC
Bilirubin Urine: NEGATIVE
Glucose, UA: NEGATIVE mg/dL
Ketones, ur: 5 mg/dL — AB
Nitrite: NEGATIVE
Protein, ur: 100 mg/dL — AB
Specific Gravity, Urine: 1.026 (ref 1.005–1.030)
pH: 5 (ref 5.0–8.0)

## 2024-02-22 LAB — COMPREHENSIVE METABOLIC PANEL WITH GFR
ALT: 23 U/L (ref 0–44)
AST: 17 U/L (ref 15–41)
Albumin: 3.5 g/dL (ref 3.5–5.0)
Alkaline Phosphatase: 75 U/L (ref 38–126)
Anion gap: 13 (ref 5–15)
BUN: 12 mg/dL (ref 8–23)
CO2: 23 mmol/L (ref 22–32)
Calcium: 9.5 mg/dL (ref 8.9–10.3)
Chloride: 101 mmol/L (ref 98–111)
Creatinine, Ser: 0.96 mg/dL (ref 0.44–1.00)
GFR, Estimated: >60 mL/min (ref >60)
Glucose, Bld: 108 mg/dL — ABNORMAL HIGH (ref 70–99)
Potassium: 3.9 mmol/L (ref 3.5–5.1)
Sodium: 137 mmol/L (ref 135–145)
Total Bilirubin: 2 mg/dL — ABNORMAL HIGH (ref 0.0–1.2)
Total Protein: 7.4 g/dL (ref 6.5–8.1)

## 2024-02-22 LAB — CBC
HCT: 38.1 % (ref 36.0–46.0)
Hemoglobin: 12.4 g/dL (ref 12.0–15.0)
MCH: 28.4 pg (ref 26.0–34.0)
MCHC: 32.5 g/dL (ref 30.0–36.0)
MCV: 87.4 fL (ref 80.0–100.0)
Platelets: 319 10*3/uL (ref 150–400)
RBC: 4.36 MIL/uL (ref 3.87–5.11)
RDW: 15.5 % (ref 11.5–15.5)
WBC: 20.8 10*3/uL — ABNORMAL HIGH (ref 4.0–10.5)
nRBC: 0 % (ref 0.0–0.2)

## 2024-02-22 LAB — RESP PANEL BY RT-PCR (RSV, FLU A&B, COVID)  RVPGX2
Influenza A by PCR: NEGATIVE
Influenza B by PCR: NEGATIVE
Resp Syncytial Virus by PCR: NEGATIVE
SARS Coronavirus 2 by RT PCR: NEGATIVE

## 2024-02-22 LAB — LIPASE, BLOOD: Lipase: 22 U/L (ref 11–51)

## 2024-02-22 MED ORDER — LEVOFLOXACIN 500 MG PO TABS: 500.0000 mg | ORAL_TABLET | Freq: Every day | ORAL | 0 refills | Status: DC

## 2024-02-22 MED ORDER — ACETAMINOPHEN 325 MG PO TABS
650.0000 mg | ORAL_TABLET | Freq: Once | ORAL | Status: AC
  Administered 2024-02-22: 650 mg via ORAL
  Filled 2024-02-22: qty 2

## 2024-02-22 MED ORDER — LEVOFLOXACIN 500 MG PO TABS
500.0000 mg | ORAL_TABLET | Freq: Once | ORAL | Status: AC
  Administered 2024-02-22: 500 mg via ORAL
  Filled 2024-02-22: qty 1

## 2024-02-22 NOTE — ED Triage Notes (Signed)
 Pt came in via POV d/t HA for the past week & when she went to UC her BP was reported low as well as her O2. During triage her BP is 128/97 & O2 is 93% on RA. A/Ox4 rates her throbbing HA pain 5/10. Also reports feeling very fatigued, decreased appetite, had felt nauseous, denies vomiting.

## 2024-02-22 NOTE — ED Provider Notes (Signed)
 [facility]    CSN: [id number] Arrival date & time: 02/22/24  1222      History   Chief Complaint Chief Complaint  Patient presents with   Headache   Nausea    HPI Nicole Joyce is a 68 y.o. female who presents with HA, loss of appetite, fagitue, and nausea x 1 week. She only feels nausea when she tries to eat and presses her abdomen. Has not vomited. Denies GI symptoms including C/D/V. Has been sweating and chilling. Has had a low grade temp. Has not tested herself for Covid.  Has a chronic cough from her COPD but is not any worse. Has not smoked in 10 months. Has been using her inhalers daily.     Past Medical History:  Diagnosis Date   Abnormal lung sounds 11/18/2020   Anemia    Asthma    Bilateral arm pain 02/03/2020   Community acquired pneumonia of right lower lobe of lung 06/29/2023   Dysmenorrhea    Elevated lipids    Endometriosis    Estrogen deficiency 11/18/2020   Fall 01/05/2016   Frequent falls 12/28/2023   History of endometriosis 11/18/2020   History of shingles 02/03/2020   Hormone disorder    Infertility, female    Need for COVID-19 vaccine 12/02/2021   Nocturnal muscle cramps 01/26/2023   PID (pelvic inflammatory disease)    Smoker 02/03/2020    Patient Active Problem List   Diagnosis Date Noted   Remote neck injury 01/29/2024   Bilateral lumbar radiculopathy 12/28/2023   Muscle spasms of both lower extremities 12/28/2023   Weight gain 12/28/2023   Pulmonary emphysema determined by X-ray (HCC) 06/29/2023   Vitamin D  deficiency 01/26/2023   Encounter for annual physical exam 01/26/2023   Postmenopausal estrogen deficiency 12/02/2021   Situational anxiety 12/02/2021   Risk of myocardial infarction or stroke 7.5% or greater in next 10 years 12/02/2021   Environmental allergies 12/02/2021   Elevated lipids    Chronic constipation 11/18/2020   Dyspareunia in female 11/18/2020   Chronic right-sided low back pain with right-sided  sciatica 02/03/2020   Mild intermittent asthma without complication 02/03/2020    Past Surgical History:  Procedure Laterality Date   ABDOMINAL SURGERY     exploratory laparotomy with treatment of endometriosis   PELVIC LAPAROSCOPY     ROTATOR CUFF REPAIR      OB History     Gravida  1   Para  1   Term  1   Preterm      AB      Living  1      SAB      IAB      Ectopic      Multiple      Live Births  1            Home Medications    Prior to Admission medications   Medication Sig Start Date End Date Taking? Authorizing Provider  albuterol  (VENTOLIN  HFA) 108 (90 Base) MCG/ACT inhaler TAKE 2 PUFFS BY MOUTH EVERY 6 HOURS AS NEEDED FOR WHEEZE OR SHORTNESS OF BREATH 01/25/24  Yes Early, Sara E, NP  atorvastatin  (LIPITOR) 10 MG tablet TAKE 1 TABLET BY MOUTH EVERY DAY 12/25/23  Yes Early, Sara E, NP  Fluticasone-Umeclidin-Vilant (TRELEGY ELLIPTA ) 200-62.5-25 MCG/ACT AEPB One inhalation daily for 28 days. 01/29/24  Yes Early, Sara E, NP  gabapentin  (NEURONTIN ) 300 MG capsule Take 1 capsule (300 mg total) by mouth 3 (three) times daily. 01/18/24 04/17/24  Yes Diedra Fowler, MD  levocetirizine (XYZAL ) 5 MG tablet Take 1 tablet (5 mg total) by mouth every evening. For allergies and congestion. 01/08/24  Yes Early, Sara E, NP  Cholecalciferol (VITAMIN D3) 1.25 MG (50000 UT) TABS Take 1 tablet by mouth once a week. 08/12/23   Tysinger, Christiane Cowing, PA-C    Family History Family History  Problem Relation Age of Onset   COPD Mother    Throat cancer Father    Hypertension Sister    COPD Sister    Hypertension Brother    COPD Brother    Lung cancer Paternal Grandfather    Hypertension Sister    Hypertension Sister     Social History Social History   Tobacco Use   Smoking status: Former    Current packs/day: 0.00    Average packs/day: 0.5 packs/day for 51.4 years (25.7 ttl pk-yrs)    Types: Cigarettes    Start date: 10/18/1971    Quit date: 03/18/2023    Years since  quitting: 0.9    Passive exposure: Past   Smokeless tobacco: Never  Vaping Use   Vaping status: Never Used  Substance Use Topics   Alcohol use: Not Currently   Drug use: Not Currently    Comment: stopped using marijuana in June 2024     Allergies   Patient has no known allergies.   Review of Systems Review of Systems  As noted in HPI Physical Exam Triage Vital Signs ED Triage Vitals  Encounter Vitals Group     BP 02/22/24 1244 96/66     Systolic BP Percentile --      Diastolic BP Percentile --      Pulse Rate 02/22/24 1244 (!) 102     Resp 02/22/24 1244 18     Temp 02/22/24 1244 99.9 F (37.7 C)     Temp Source 02/22/24 1244 Oral     SpO2 02/22/24 1244 92 %     Weight --      Height --      Head Circumference --      Peak Flow --      Pain Score 02/22/24 1243 5     Pain Loc --      Pain Education --      Exclude from Growth Chart --    No data found.  Updated Vital Signs BP 96/66 (BP Location: Left Arm)   Pulse (!) 102   Temp 99.9 F (37.7 C) (Oral)   Resp 18   SpO2 92%   Visual Acuity Right Eye Distance:   Left Eye Distance:   Bilateral Distance:    Right Eye Near:   Left Eye Near:    Bilateral Near:     Physical Exam  Physical Exam Constitutional:      General: SHe is not in acute distress.    Appearance: SHe is not toxic-appearing but appears ill.   HENT:     Head: Normocephalic.     Right Ear: Tympanic membrane, ear canal and external ear normal.     Left Ear: Ear canal and external ear normal.     Nose: Nose normal. Sinuses are not tender.     Mouth/Throat: clear    Mouth: Mucous membranes are moist.     Pharynx: Oropharynx is clear.  Eyes: PERRLA EOMI    General: No scleral icterus.    Conjunctiva/sclera: Conjunctivae normal.  Cardiovascular:     Rate and Rhythm: Normal rate and regular rhythm.  Heart sounds: No murmur heard.  Ambdomen- + BS, soft, no tenderness, masses or hepatospleenomegally.  Pulmonary:     Effort:  Pulmonary effort is normal. No respiratory distress.     Breath sounds: has mild Wheezing present at the end of expirations. Has crackles on LLL.      Comments: Has auditory wheezie sounding cough.  Musculoskeletal:        General: Normal range of motion.     Cervical back: Neck supple.  Lymphadenopathy:     Cervical: No cervical adenopathy.  Skin:    General: Skin is warm and dry.     Findings: No rash.  Neurological:     Mental Status: He is alert and oriented to person, place, and time.     Gait: Gait normal.  Psychiatric:        Mood and Affect: Mood normal.        Behavior: Behavior normal.        Thought Content: Thought content normal.        Judgment: Judgment normal.   UC Treatments / Results  Labs (all labs ordered are listed, but only abnormal results are displayed) Labs Reviewed - No data to display  EKG   Radiology DG Chest 2 View Result Date: 02/22/2024 CLINICAL DATA:  low pulse ox, crackles LLL EXAM: CHEST - 2 VIEW COMPARISON:  June 06, 2023, November 18, 2020 FINDINGS: No focal airspace consolidation, pleural effusion, or pneumothorax. No cardiomegaly. Tortuous aorta with aortic atherosclerosis. No acute fracture or destructive lesion. Multilevel thoracic osteophytosis. IMPRESSION: No acute cardiopulmonary abnormality. Electronically Signed   By: Rance Burrows M.D.   On: 02/22/2024 13:47    Procedures Procedures (including critical care time)  Medications Ordered in UC Medications - No data to display  Initial Impression / Assessment and Plan / UC Course  I have reviewed the triage vital signs and the nursing notes.  Pertinent imaging results that were available during my care of the patient were reviewed by me and considered in my medical decision making (see chart for details).  Pt is hypotensive, low pulse ox, and little tachy, and with all the symptoms going on for a week, I dont have an etiology of this, therefore I sent her to ER for further work  up.    Final Clinical Impressions(s) / UC Diagnoses   Final diagnoses:  Acute cough  Loss of appetite  Weakness generalized  Persistent headaches  Wheezing  Other specified hypotension  Fever, unspecified     Discharge Instructions      Your chest xray does not show pneumonia You need to go to ER to have more tests done, than what we have ability to do here.    ED Prescriptions   None    PDMP not reviewed this encounter.   Vonda Guadeloupe, PA-C 02/22/24 1400

## 2024-02-22 NOTE — ED Notes (Signed)
 Patient is being discharged from the Urgent Care and sent to the Emergency Department via POV . Per Vonda Guadeloupe, PA-C, patient is in need of higher level of care due to hypotensive, decreased O2 (neg chest Xray), possible sepsis. Patient is aware and verbalizes understanding of plan of care.  Vitals:   02/22/24 1244  BP: 96/66  Pulse: (!) 102  Resp: 18  Temp: 99.9 F (37.7 C)  SpO2: 92%

## 2024-02-22 NOTE — Discharge Instructions (Addendum)
 Your chest xray does not show pneumonia You need to go to ER to have more tests done, than what we have ability to do here.

## 2024-02-22 NOTE — Discharge Instructions (Signed)
 Please follow-up with your primary care provider in regards to recent symptoms and ER visit.  Today labs and imaging are ultimately reassuring however you may have a pneumonia not found on the chest x-ray from the urgent care given your physical exam.  You are given your first dose of antibiotics here but will need to take the rest.  If symptoms change or worsen please return to the ER.  Please remain hydrated and eat food as tolerated.

## 2024-02-22 NOTE — Telephone Encounter (Signed)
 Copied from CRM [id number]. Topic: Clinical - Red Word Triage >> Feb 22, 2024  8:00 AM Oddis Bench wrote: Red Word that prompted transfer to Nurse Triage: Patient is stating that she has a headache for a weak, can't eat , has chills and a slight headache.    Chief Complaint: Headaches, sides of head, constant, chills Symptoms: Above Frequency: 1 week Pertinent Negatives: Patient denies  Disposition: [] ED /[x] Urgent Care (no appt availability in office) / [] Appointment(In office/virtual)/ []  [facility]/ [] Home Care/ [] Refused Recommended Disposition /[] [facility]/ []  Follow-up with PCP Additional Notes: No availability, pt. Going to UC.  Reason for Disposition  [1] MODERATE headache (e.g., interferes with normal activities) AND [2] present > 24 hours AND [3] unexplained  (Exceptions: analgesics not tried, typical migraine, or headache part of viral illness)  Answer Assessment - Initial Assessment Questions 1. LOCATION: "Where does it hurt?"      Sides of head 2. ONSET: "When did the headache start?" (Minutes, hours or days)      1 week 3. PATTERN: "Does the pain come and go, or has it been constant since it started?"     Constant 4. SEVERITY: "How bad is the pain?" and "What does it keep you from doing?"  (e.g., Scale 1-10; mild, moderate, or severe)   - MILD (1-3): doesn't interfere with normal activities    - MODERATE (4-7): interferes with normal activities or awakens from sleep    - SEVERE (8-10): excruciating pain, unable to do any normal activities        Moderate 5. RECURRENT SYMPTOM: "Have you ever had headaches before?" If Yes, ask: "When was the last time?" and "What happened that time?"      yes 6. CAUSE: "What do you think is causing the headache?"     Unsure 7. MIGRAINE: "Have you been diagnosed with migraine headaches?" If Yes, ask: "Is this headache similar?"      no 8. HEAD INJURY: "Has there been any recent injury to the head?"      no 9. OTHER  SYMPTOMS: "Do you have any other symptoms?" (fever, stiff neck, eye pain, sore throat, cold symptoms)     chills 10. PREGNANCY: "Is there any chance you are pregnant?" "When was your last menstrual period?"       no  Protocols used: Headache-A-AH

## 2024-02-22 NOTE — ED Provider Notes (Signed)
 [place] EMERGENCY DEPARTMENT AT [facility] Provider Note   CSN: [id number] Arrival date & time: 02/22/24  1414     History  Chief Complaint  Patient presents with   Headache   Nausea    Nicole Joyce is a 68 y.o. female history of lumbar radiculopathy with recent epidural injection on 02/08/2024 presented for 1 week of bilateral headache that feels like a band.  Patient is unsure of fevers but went to urgent care and was referred here for further workup.  Patient had negative chest x-ray.  Patient states that she has had decreased appetite and feels nauseous but has not vomited.  Patient denies any vision changes or jaw claudication.  Patient denies any paresthesia new onset weakness.  Patient is unsure of sick contacts.  Patient denies chest pain shortness of breath.  Patient has tried antihistamines to no relief.   Home Medications Prior to Admission medications   Medication Sig Start Date End Date Taking? Authorizing Provider  albuterol  (VENTOLIN  HFA) 108 (90 Base) MCG/ACT inhaler TAKE 2 PUFFS BY MOUTH EVERY 6 HOURS AS NEEDED FOR WHEEZE OR SHORTNESS OF BREATH 01/25/24   Early, Adriane Albe, NP  atorvastatin  (LIPITOR) 10 MG tablet TAKE 1 TABLET BY MOUTH EVERY DAY 12/25/23   Early, Sara E, NP  Cholecalciferol (VITAMIN D3) 1.25 MG (50000 UT) TABS Take 1 tablet by mouth once a week. 08/12/23   Tysinger, Christiane Cowing, PA-C  Fluticasone-Umeclidin-Vilant (TRELEGY ELLIPTA ) 200-62.5-25 MCG/ACT AEPB One inhalation daily for 28 days. 01/29/24   Early, Sara E, NP  gabapentin  (NEURONTIN ) 300 MG capsule Take 1 capsule (300 mg total) by mouth 3 (three) times daily. 01/18/24 04/17/24  Diedra Fowler, MD  levocetirizine (XYZAL ) 5 MG tablet Take 1 tablet (5 mg total) by mouth every evening. For allergies and congestion. 01/08/24   Early, Sara E, NP      Allergies    Patient has no known allergies.    Review of Systems   Review of Systems  Neurological:  Positive for headaches.    Physical  Exam Updated Vital Signs BP (!) 128/97 (BP Location: Left Arm)   Pulse (!) 107   Temp 100.3 F (37.9 C)   Resp 20   Ht 5\' 4"  (1.626 m)   Wt 87.1 kg   SpO2 93%   BMI 32.96 kg/m  Physical Exam Constitutional:      Appearance: She is normal weight.  HENT:     Head: Normocephalic.     Comments: No temporal artery tenderness No jaw claudication Eyes:     Extraocular Movements: Extraocular movements intact.     Conjunctiva/sclera: Conjunctivae normal.     Pupils: Pupils are equal, round, and reactive to light.  Cardiovascular:     Rate and Rhythm: Regular rhythm. Tachycardia present.     Pulses: Normal pulses.     Heart sounds: Normal heart sounds.  Pulmonary:     Effort: Pulmonary effort is normal.     Breath sounds: Normal breath sounds.  Abdominal:     Palpations: Abdomen is soft.     Tenderness: There is no abdominal tenderness. There is no guarding or rebound.  Musculoskeletal:        General: Normal range of motion.     Cervical back: Normal range of motion. No rigidity or tenderness.  Skin:    General: Skin is warm and dry.     Capillary Refill: Capillary refill takes less than 2 seconds.  Neurological:     General:  No focal deficit present.     Mental Status: She is alert.     Sensory: Sensation is intact.     Motor: Motor function is intact.     Coordination: Coordination is intact.     Gait: Gait is intact.     Comments: Cranial nerves III through XII intact Vision grossly intact Sensation tact in all 4 extremities  Psychiatric:        Mood and Affect: Mood normal.     ED Results / Procedures / Treatments   Labs (all labs ordered are listed, but only abnormal results are displayed) Labs Reviewed  COMPREHENSIVE METABOLIC PANEL WITH GFR - Abnormal; Notable for the following components:      Result Value   Glucose, Bld 108 (*)    Total Bilirubin 2.0 (*)    All other components within normal limits  CBC - Abnormal; Notable for the following components:    WBC 20.8 (*)    All other components within normal limits  URINALYSIS, ROUTINE W REFLEX MICROSCOPIC - Abnormal; Notable for the following components:   Color, Urine AMBER (*)    APPearance CLOUDY (*)    Hgb urine dipstick MODERATE (*)    Ketones, ur 5 (*)    Protein, ur 100 (*)    Leukocytes,Ua SMALL (*)    Bacteria, UA RARE (*)    All other components within normal limits  RESP PANEL BY RT-PCR (RSV, FLU A&B, COVID)  RVPGX2  LIPASE, BLOOD    EKG None  Radiology DG Chest 2 View Result Date: 02/22/2024 CLINICAL DATA:  low pulse ox, crackles LLL EXAM: CHEST - 2 VIEW COMPARISON:  June 06, 2023, November 18, 2020 FINDINGS: No focal airspace consolidation, pleural effusion, or pneumothorax. No cardiomegaly. Tortuous aorta with aortic atherosclerosis. No acute fracture or destructive lesion. Multilevel thoracic osteophytosis. IMPRESSION: No acute cardiopulmonary abnormality. Electronically Signed   By: Rance Burrows M.D.   On: 02/22/2024 13:47    Procedures Procedures    Medications Ordered in ED Medications  acetaminophen  (TYLENOL ) tablet 650 mg (has no administration in time range)    ED Course/ Medical Decision Making/ A&P                                 Medical Decision Making Amount and/or Complexity of Data Reviewed Labs: ordered. Radiology: ordered.  Risk OTC drugs.   Nicole Joyce 68 y.o. presented today for headache. Working DDx that I considered at this time includes, but not limited to, tension headache, migraine, intracranial mass, intracranial hemorrhage, intracranial infection including meningitis vs encephalitis, GCA, trigeminal neuralgia, AVM, sinusitis, cerebral aneurysm, muscular headache, cavernous sinus thrombosis, carotid artery dissection.  R/o DDx: intracranial mass, intracranial hemorrhage, intracranial infection including meningitis vs encephalitis, GCA, trigeminal neuralgia, AVM, sinusitis, cerebral aneurysm, muscular headache, cavernous sinus  thrombosis, carotid artery dissection:  less likely due to history of present illness, physical exam, labs/imaging findings  Review of prior external notes: 06/05/2023 office visit  Unique Tests and My Independent Interpretation:  CBC: Leukocytosis 20.8 CMP: Unremarkable UA: Pyuria noted Respiratory panel: Negative Lipase: Unremarkable CT Head w/o Contrast: No acute pathology  Social Determinants of Health: none  Discussion with Independent Historian: None  Discussion of Management of Tests: None  Risk: Medium: prescription drug management  Risk Stratification Score: none  Staffed with Steinl, MD  Plan: On exam patient was no acute distress mildly tachycardic on exam.  Patient's neuroexam was ultimately  unremarkable.  Labs in triage do show a white count of 20.8 however patient did have epidural injection for her lumbar radiculopathy on 02/08/2024 which could be contributing.  Patient also does have borderline temperature of 100.3 F and is endorsing decreased appetite along with nausea with this headache.  Will give Tylenol  and check a respiratory panel as this could be viral along with CT head as patient is elderly.  Patient's labs and imaging ultimately reassuring.  Patient is not endorsing any urinary symptoms with me and so will not treat the asymptomatic bacteria.  Attending evaluated patient and recommends Levaquin  500 mg for 5 days and have her follow-up as he heard rhonchi in the left lower lobe.  Patient has improved with medications here and is safe to discharge.  Patient passed p.o. challenge.  I strongly encourage patient to use Tylenol  every 6 hours needed for pain and remain hydrated food as tolerated.  Patient was given return precautions. Patient stable for discharge at this time.  Patient verbalized understanding of plan.  This chart was dictated using voice recognition software.  Despite best efforts to proofread,  errors can occur which can change the documentation  meaning.        Final Clinical Impression(s) / ED Diagnoses Final diagnoses:  None    Rx / DC Orders ED Discharge Orders     None         Elex Grimmer 02/22/24 2137    Guadalupe Lee, MD 02/23/24 [phone]

## 2024-02-22 NOTE — ED Triage Notes (Signed)
 Chief Complaint: headache, loss of appetite, weakness, fatigue, and nausea. Denies diarrhea or emesis.   Sick exposure: No  Onset: 1 week ago  Prescriptions or OTC medications tried: Yes- Mucus relief, Trellegy, Albuterol    with no relief  New foods, medications, or products: No  Recent Travel: No

## 2024-02-29 ENCOUNTER — Other Ambulatory Visit: Payer: Self-pay

## 2024-02-29 DIAGNOSIS — Z122 Encounter for screening for malignant neoplasm of respiratory organs: Secondary | ICD-10-CM

## 2024-02-29 DIAGNOSIS — Z87891 Personal history of nicotine dependence: Secondary | ICD-10-CM

## 2024-03-18 ENCOUNTER — Encounter: Payer: Self-pay | Admitting: Acute Care

## 2024-03-26 ENCOUNTER — Other Ambulatory Visit: Payer: Medicare PPO

## 2024-03-29 ENCOUNTER — Other Ambulatory Visit: Payer: Self-pay | Admitting: Nurse Practitioner

## 2024-03-29 DIAGNOSIS — Z9109 Other allergy status, other than to drugs and biological substances: Secondary | ICD-10-CM

## 2024-04-03 ENCOUNTER — Ambulatory Visit (INDEPENDENT_AMBULATORY_CARE_PROVIDER_SITE_OTHER): Admitting: Orthopedic Surgery

## 2024-04-03 DIAGNOSIS — M48062 Spinal stenosis, lumbar region with neurogenic claudication: Secondary | ICD-10-CM | POA: Diagnosis not present

## 2024-04-03 NOTE — Progress Notes (Signed)
 Orthopedic Spine Surgery Office Note   Assessment: Patient is a 68 y.o. female with low back pain that radiates into bilateral buttock and left lateral thighs. Her pain gets better if she flexes her lumbar spine. Has central stenosis at L3/4 and L4/5 consistent with neurogenic claudication     Plan: -Patient has tried  tylenol , oral steroids, robaxin , gabapentin , lumbar steroid injections -Patient had done well with injections in the past but her most recent only lasted about 2 weeks so would not repeat this in the future -She is noticing some relief with gabapentin  so she will continue that going forward -Talked about surgery as an option for her but she was not interested in that at this time.  She is going to try water aerobics and see if some weight loss would help her -Patient should return to office in 10 weeks, x-rays at next visit: none     Patient expressed understanding of the plan and all questions were answered to the patient's satisfaction.    ___________________________________________________________________________     History:   Patient is a 68 y.o. female who presents today for follow-up on her lumbar spine.  Patient continues to have low back pain that radiates into her bilateral buttocks and bilateral lateral thighs.  Her right side is more symptomatic than her left at this time.  After her last visit, she got an injection with Dr. Daisey Dryer and said that that helped.  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  She currently is back to where she was before the injection.  She notices the pain is worse if she is standing or walking for more than a couple minutes.  Gets better if she sits down or flexes her lumbar spine.  Has not developed any new symptoms since she was last seen in the office.   Treatments tried: tylenol , oral steroids, robaxin , gabapentin , lumbar steroid injections      Physical Exam:   General: no acute distress, appears stated age Neurologic: alert, answering questions appropriately, following commands Respiratory: unlabored breathing on room air, symmetric chest rise Psychiatric: appropriate affect, normal cadence to speech     MSK (spine):   -Strength exam                                                   Left                  Right EHL                              5/5                  5/5 TA                                 5/5                  5/5 GSC                             5/5  5/5 Knee extension            5/5                  5/5 Hip flexion                    5/5                  5/5   -Sensory exam                           Sensation intact to light touch in L2-S1 nerve distributions of bilateral lower extremities   Imaging: XRs of the lumbar spine from 01/18/2024 were previously independently reviewed and interpreted, showing grade 1 spondylolisthesis at L3/4 and L4/5. Disc height loss at L4/5. No fracture or dislocation seen. Lordotic alignment.    MRI of the lumbar spine from 01/05/2024 was previously independently reviewed and interpreted, showing central stenosis at L3/4 and L4/5. Spondylolisthesis seen at L3/4 and L4/5. Foraminal stenosis on the right at L3/4. Foraminal stenosis bilaterally at L4/5.      Patient name: Nicole Joyce Patient MRN: [id number] Date of visit: 04/03/24

## 2024-04-23 ENCOUNTER — Ambulatory Visit

## 2024-04-23 DIAGNOSIS — Z0000 Encounter for general adult medical examination without abnormal findings: Secondary | ICD-10-CM | POA: Diagnosis not present

## 2024-04-23 DIAGNOSIS — E2839 Other primary ovarian failure: Secondary | ICD-10-CM

## 2024-04-23 NOTE — Patient Instructions (Signed)
 Ms. Eberle , Thank you for taking time out of your busy schedule to complete your Annual Wellness Visit with me. I enjoyed our conversation and look forward to speaking with you again next year. I, as well as your care team,  appreciate your ongoing commitment to your health goals. Please review the following plan we discussed and let me know if I can assist you in the future. Your Game plan/ To Do List    Referrals: If you haven't heard from the office you've been referred to, please reach out to them at the phone provided.  You have an order for:  []   2D Mammogram  []   3D Mammogram  [x]   Bone Density     Please call for appointment:   [facility] [address] [place], [place] [address] [phone]     Make sure to wear two-piece clothing.  No lotions, powders, or deodorants the day of the appointment. Make sure to bring picture ID and insurance card.  Bring list of medications you are currently taking including any supplements.   Follow up Visits: Next Medicare AWV with our clinical staff: 04/29/2025 at 4:20   Have you seen your provider in the last 6 months (3 months if uncontrolled diabetes)? Yes Next Office Visit with your provider: 02/03/2025 at 3:15  Clinician Recommendations:  Aim for 30 minutes of exercise or brisk walking, 6-8 glasses of water, and 5 servings of fruits and vegetables each day.       This is a list of the screening recommended for you and due dates:  Health Maintenance  Topic Date Due   Zoster (Shingles) Vaccine (1 of 2) Never done   DEXA scan (bone density measurement)  Never done   COVID-19 Vaccine (4 - 2024-25 season) 06/18/2023   Screening for Lung Cancer  01/29/2025   Mammogram  04/02/2025   Medicare Annual Wellness Visit  04/23/2025   Colon Cancer Screening  07/12/2025   DTaP/Tdap/Td vaccine (2 - Td or Tdap) 08/12/2031   Hepatitis C Screening  Completed   Hepatitis B Vaccine  Aged Out   HPV Vaccine  Aged Out   Meningitis B  Vaccine  Aged Out   Pneumococcal Vaccine for age over 67  Discontinued   Flu Shot  Discontinued    Advanced directives: (In Chart) A copy of your advanced directives are scanned into your chart should your provider ever need it. Advance Care Planning is important because it:  [x]  Makes sure you receive the medical care that is consistent with your values, goals, and preferences  [x]  It provides guidance to your family and loved ones and reduces their decisional burden about whether or not they are making the right decisions based on your wishes.  Follow the link provided in your after visit summary or read over the paperwork we have mailed to you to help you started getting your Advance Directives in place. If you need assistance in completing these, please reach out to us  so that we can help you!  See attachments for Preventive Care and Fall Prevention Tips.

## 2024-04-23 NOTE — Progress Notes (Signed)
 Subjective:   Nicole Joyce is a 68 y.o. who presents for a Medicare Wellness preventive visit.  As a reminder, Annual Wellness Visits don't include a physical exam, and some assessments may be limited, especially if this visit is performed virtually. We may recommend an in-person follow-up visit with your provider if needed.  Visit Complete: Virtual I connected with  Nicole Joyce on 04/23/24 by a video and audio enabled telemedicine application and verified that I am speaking with the correct person using two identifiers.  Patient Location: Home  Provider Location: Office/Clinic  I discussed the limitations of evaluation and management by telemedicine. The patient expressed understanding and agreed to proceed.  Vital Signs: Because this visit was a virtual/telehealth visit, some criteria may be missing or patient reported. Any vitals not documented were not able to be obtained and vitals that have been documented are patient reported.    Persons Participating in Visit: Patient.  AWV Questionnaire: Yes: Patient Medicare AWV questionnaire was completed by the patient on 04/23/2024; I have confirmed that all information answered by patient is correct and no changes since this date.  Cardiac Risk Factors include: advanced age (>24men, >66 women)     Objective:    Today's Vitals   04/23/24 1557  PainSc: 6    There is no height or weight on file to calculate BMI.     04/23/2024    4:13 PM 02/22/2024    3:16 PM 06/06/2023    9:19 PM 12/27/2022   12:00 PM 07/12/2022    5:12 AM 12/02/2021    3:10 PM 03/10/2019   10:48 AM  Advanced Directives  Does Patient Have a Medical Advance Directive? Yes Yes No Yes No No No  Type of Advance Directive Out of facility DNR (pink MOST or yellow form) Healthcare Power of Attorney  Out of facility DNR (pink MOST or yellow form)     Does patient want to make changes to medical advance directive?  No - Patient declined       Copy of Healthcare Power of  Attorney in Chart?  No - copy requested, Physician notified       Would patient like information on creating a medical advance directive?   No - Patient declined  No - Patient declined No - Patient declined Yes (ED - Information included in AVS)      Data saved with a previous flowsheet row definition    Current Medications (verified) Outpatient Encounter Medications as of 04/23/2024  Medication Sig   albuterol  (VENTOLIN  HFA) 108 (90 Base) MCG/ACT inhaler TAKE 2 PUFFS BY MOUTH EVERY 6 HOURS AS NEEDED FOR WHEEZE OR SHORTNESS OF BREATH   atorvastatin  (LIPITOR) 10 MG tablet TAKE 1 TABLET BY MOUTH EVERY DAY   Cholecalciferol (VITAMIN D3) 1.25 MG (50000 UT) TABS Take 1 tablet by mouth once a week.   gabapentin  (NEURONTIN ) 300 MG capsule Take 1 capsule (300 mg total) by mouth 3 (three) times daily. (Patient taking differently: Take 300 mg by mouth as needed.)   levocetirizine (XYZAL ) 5 MG tablet TAKE 1 TABLET (5 MG TOTAL) BY MOUTH EVERY EVENING. FOR ALLERGIES AND CONGESTION.   Fluticasone-Umeclidin-Vilant (TRELEGY ELLIPTA ) 200-62.5-25 MCG/ACT AEPB One inhalation daily for 28 days. (Patient not taking: Reported on 04/23/2024)   levofloxacin  (LEVAQUIN ) 500 MG tablet Take 1 tablet (500 mg total) by mouth daily. (Patient not taking: Reported on 04/23/2024)   No facility-administered encounter medications on file as of 04/23/2024.    Allergies (verified) Patient has no known  allergies.   History: Past Medical History:  Diagnosis Date   Abnormal lung sounds 11/18/2020   Anemia    Asthma    Bilateral arm pain 02/03/2020   Community acquired pneumonia of right lower lobe of lung 06/29/2023   Dysmenorrhea    Elevated lipids    Endometriosis    Estrogen deficiency 11/18/2020   Fall 01/05/2016   Frequent falls 12/28/2023   History of endometriosis 11/18/2020   History of shingles 02/03/2020   Hormone disorder    Infertility, female    Need for COVID-19 vaccine 12/02/2021   Nocturnal muscle cramps  01/26/2023   PID (pelvic inflammatory disease)    Smoker 02/03/2020   Past Surgical History:  Procedure Laterality Date   ABDOMINAL SURGERY     exploratory laparotomy with treatment of endometriosis   PELVIC LAPAROSCOPY     ROTATOR CUFF REPAIR     Family History  Problem Relation Age of Onset   COPD Mother    Throat cancer Father    Hypertension Sister    COPD Sister    Hypertension Brother    COPD Brother    Lung cancer Paternal Grandfather    Hypertension Sister    Hypertension Sister    Social History   Socioeconomic History   Marital status: Married    Spouse name: Not on file   Number of children: Not on file   Years of education: Not on file   Highest education level: Bachelor's degree (e.g., BA, AB, BS)  Occupational History   Not on file  Tobacco Use   Smoking status: Former    Current packs/day: 0.00    Average packs/day: 0.5 packs/day for 51.4 years (25.7 ttl pk-yrs)    Types: Cigarettes    Start date: 10/18/1971    Quit date: 03/18/2023    Years since quitting: 1.1    Passive exposure: Past   Smokeless tobacco: Never  Vaping Use   Vaping status: Never Used  Substance and Sexual Activity   Alcohol use: Not Currently   Drug use: Not Currently    Comment: stopped using marijuana in June 2024   Sexual activity: Not Currently    Birth control/protection: None  Other Topics Concern   Not on file  Social History Narrative   Not on file   Social Drivers of Health   Financial Resource Strain: Low Risk  (04/23/2024)   Overall Financial Resource Strain (CARDIA)    Difficulty of Paying Living Expenses: Not very hard  Food Insecurity: No Food Insecurity (04/23/2024)   Hunger Vital Sign    Worried About Running Out of Food in the Last Year: Never true    Ran Out of Food in the Last Year: Never true  Transportation Needs: No Transportation Needs (04/23/2024)   PRAPARE - Administrator, Civil Service (Medical): No    Lack of Transportation  (Non-Medical): No  Physical Activity: Sufficiently Active (04/23/2024)   Exercise Vital Sign    Days of Exercise per Week: 4 days    Minutes of Exercise per Session: 40 min  Recent Concern: Physical Activity - Insufficiently Active (01/29/2024)   Exercise Vital Sign    Days of Exercise per Week: 3 days    Minutes of Exercise per Session: 40 min  Stress: Stress Concern Present (04/23/2024)   [employer] of Occupational Health - Occupational Stress Questionnaire    Feeling of Stress: To some extent  Social Connections: Moderately Integrated (04/23/2024)   Social Connection and Isolation Panel  Frequency of Communication with Friends and Family: More than three times a week    Frequency of Social Gatherings with Friends and Family: Once a week    Attends Religious Services: More than 4 times per year    Active Member of [employer] or Organizations: No    Attends Engineer, structural: Never    Marital Status: Married    Tobacco Counseling Counseling given: Not Answered    Clinical Intake:  Pre-visit preparation completed: Yes  Pain : 0-10 Pain Score: 6  Pain Type: Acute pain Pain Location: Ankle Pain Orientation: Right Pain Descriptors / Indicators: Burning Pain Onset: 1 to 4 weeks ago Pain Frequency: Intermittent     Nutritional Risks: None Diabetes: No  Lab Results  Component Value Date   HGBA1C 5.6 01/29/2024     How often do you need to have someone help you when you read instructions, pamphlets, or other written materials from your doctor or pharmacy?: 1 - Never  Interpreter Needed?: No  Information entered by :: NAllen LPN   Activities of Daily Living     04/23/2024    4:00 PM  In your present state of health, do you have any difficulty performing the following activities:  Hearing? 0  Vision? 0  Difficulty concentrating or making decisions? 0  Walking or climbing stairs? 1  Comment slowly  Dressing or bathing? 0  Doing errands, shopping? 0   Preparing Food and eating ? N  Using the Toilet? N  In the past six months, have you accidently leaked urine? N  Do you have problems with loss of bowel control? N  Managing your Medications? N  Managing your Finances? N  Housekeeping or managing your Housekeeping? N    Patient Care Team: Early, Sara E, NP as PCP - General (Nurse Practitioner)  I have updated your Care Teams any recent Medical Services you may have received from other providers in the past year.     Assessment:   This is a routine wellness examination for Nicole Joyce.  Hearing/Vision screen Hearing Screening - Comments:: Denies hearing issues Vision Screening - Comments:: Regular eye exams, [facility]   Goals Addressed             This Visit's Progress    Patient Stated       04/23/2024, working on losing weight       Depression Screen     04/23/2024    4:15 PM 01/29/2024    2:10 PM 12/28/2023    1:31 PM 06/27/2023    4:12 PM 01/26/2023    1:59 PM 12/27/2022   12:02 PM 12/02/2021    3:04 PM  PHQ 2/9 Scores  PHQ - 2 Score 0 0 0 0 0 0 0  PHQ- 9 Score 5          Fall Risk     04/23/2024    4:14 PM 01/29/2024    2:09 PM 12/28/2023    1:30 PM 06/27/2023    4:12 PM 01/26/2023    1:59 PM  Fall Risk   Falls in the past year? 1 1 1  0 0  Comment knee gives out      Number falls in past yr: 1 0 1 0 0  Injury with Fall? 0 1 1 0 0  Comment   scrapes and scratches,    Risk for fall due to : History of fall(s) Impaired balance/gait Impaired balance/gait No Fall Risks No Fall Risks  Follow up Falls  prevention discussed;Falls evaluation completed Falls evaluation completed Falls evaluation completed Falls evaluation completed Falls evaluation completed    MEDICARE RISK AT HOME:  Medicare Risk at Home Any stairs in or around the home?: Yes If so, are there any without handrails?: No Home free of loose throw rugs in walkways, pet beds, electrical cords, etc?: Yes Adequate lighting in your home to reduce risk of  falls?: Yes Life alert?: No Use of a cane, walker or w/c?: No Grab bars in the bathroom?: No Shower chair or bench in shower?: No Elevated toilet seat or a handicapped toilet?: No  TIMED UP AND GO:  Was the test performed?  No  Cognitive Function: 6CIT completed        04/23/2024    4:18 PM 12/27/2022   12:05 PM  6CIT Screen  What Year? 0 points 0 points  What month? 0 points 0 points  What time? 0 points 0 points  Count back from 20 0 points 0 points  Months in reverse 0 points 0 points  Repeat phrase 6 points 10 points  Total Score 6 points 10 points    Immunizations Immunization History  Administered Date(s) Administered   PFIZER(Purple Top)SARS-COV-2 Vaccination 12/14/2019, 01/04/2020   Pfizer Covid-19 Vaccine Bivalent Booster 69yrs & up 12/03/2021   Tdap 08/11/2021    Screening Tests Health Maintenance  Topic Date Due   Zoster Vaccines- Shingrix (1 of 2) Never done   DEXA SCAN  Never done   COVID-19 Vaccine (4 - 2024-25 season) 06/18/2023   Lung Cancer Screening  01/29/2025   MAMMOGRAM  04/02/2025   Medicare Annual Wellness (AWV)  04/23/2025   Colonoscopy  07/12/2025   DTaP/Tdap/Td (2 - Td or Tdap) 08/12/2031   Hepatitis C Screening  Completed   Hepatitis B Vaccines  Aged Out   HPV VACCINES  Aged Out   Meningococcal B Vaccine  Aged Out   Pneumococcal Vaccine: 50+ Years  Discontinued   INFLUENZA VACCINE  Discontinued    Health Maintenance  Health Maintenance Due  Topic Date Due   Zoster Vaccines- Shingrix (1 of 2) Never done   DEXA SCAN  Never done   COVID-19 Vaccine (4 - 2024-25 season) 06/18/2023   Health Maintenance Items Addressed: DEXA ordered, Declines vaccines.  Additional Screening:  Vision Screening: Recommended annual ophthalmology exams for early detection of glaucoma and other disorders of the eye. Would you like a referral to an eye doctor? No    Dental Screening: Recommended annual dental exams for proper oral hygiene  Community  Resource Referral / Chronic Care Management: CRR required this visit?  No   CCM required this visit?  No   Plan:    I have personally reviewed and noted the following in the patient's chart:   Medical and social history Use of alcohol, tobacco or illicit drugs  Current medications and supplements including opioid prescriptions. Patient is not currently taking opioid prescriptions. Functional ability and status Nutritional status Physical activity Advanced directives List of other physicians Hospitalizations, surgeries, and ER visits in previous 12 months Vitals Screenings to include cognitive, depression, and falls Referrals and appointments  In addition, I have reviewed and discussed with patient certain preventive protocols, quality metrics, and best practice recommendations. A written personalized care plan for preventive services as well as general preventive health recommendations were provided to patient.   Ardella FORBES Dawn, LPN   11/17/7972   After Visit Summary: (MyChart) Due to this being a telephonic visit, the after visit summary with  patients personalized plan was offered to patient via MyChart   Notes: Nothing significant to report at this time.

## 2024-04-26 ENCOUNTER — Other Ambulatory Visit: Payer: Self-pay | Admitting: Nurse Practitioner

## 2024-04-26 DIAGNOSIS — Z1231 Encounter for screening mammogram for malignant neoplasm of breast: Secondary | ICD-10-CM

## 2024-05-08 ENCOUNTER — Ambulatory Visit
Admission: RE | Admit: 2024-05-08 | Discharge: 2024-05-08 | Disposition: A | Discharge status: Home / Self Care | Source: Ambulatory Visit

## 2024-05-08 DIAGNOSIS — Z1231 Encounter for screening mammogram for malignant neoplasm of breast: Secondary | ICD-10-CM

## 2024-05-13 ENCOUNTER — Ambulatory Visit: Payer: Self-pay | Admitting: Nurse Practitioner

## 2024-05-20 ENCOUNTER — Other Ambulatory Visit: Payer: Self-pay | Admitting: Nurse Practitioner

## 2024-05-20 DIAGNOSIS — Z9189 Other specified personal risk factors, not elsewhere classified: Secondary | ICD-10-CM

## 2024-06-19 ENCOUNTER — Ambulatory Visit: Admitting: Orthopedic Surgery

## 2024-06-19 DIAGNOSIS — M48062 Spinal stenosis, lumbar region with neurogenic claudication: Secondary | ICD-10-CM | POA: Diagnosis not present

## 2024-06-19 NOTE — Progress Notes (Signed)
 Orthopedic Spine Surgery Office Note   Assessment: Patient is a 68 y.o. female with low back pain that radiates into bilateral buttock and left lateral thighs. Her pain gets better if she flexes her lumbar spine. Has central stenosis at L3/4 and L4/5 consistent with neurogenic claudication     Plan: -Patient has tried  tylenol , oral steroids, robaxin , gabapentin , lumbar steroid injections - Since patient has tried multiple conservative treatments and feels that her pain is getting worse, discussed surgery and pain management is remaining options for her.  She was not interested in any kind of surgical management and wanted to try pain management.  Referral provided to her today -Patient should return to office on an as needed basis     Patient expressed understanding of the plan and all questions were answered to the patient's satisfaction.    ___________________________________________________________________________     History:   Patient is a 68 y.o. female who presents today for follow-up on her lumbar spine.  Patient continues to have low back pain that radiates into her bilateral buttocks and bilateral lateral thighs.  Her right side is still more symptomatic than her left side.  She feels that her pain is worse than the last time she was seen in the office.  She did try water aerobics and losing weight but did not find that helpful.  She is frustrated because she feels the pain on a daily basis.  She can only do a little bit of activity before she has to sit down to help her pain.  She has not noticed any new symptoms since she was last in the office.   Treatments tried: tylenol , oral steroids, robaxin , gabapentin , lumbar steroid injections      Physical Exam:   General: no acute distress, appears stated age Neurologic: alert, answering questions appropriately, following commands Respiratory: unlabored breathing on room air, symmetric chest rise Psychiatric: appropriate affect,  normal cadence to speech     MSK (spine):   -Strength exam                                                   Left                  Right EHL                              5/5                  5/5 TA                                 5/5                  5/5 GSC                             5/5                  5/5 Knee extension            5/5                  5/5 Hip flexion  5/5                  5/5   -Sensory exam                           Sensation intact to light touch in L2-S1 nerve distributions of bilateral lower extremities   Imaging: XRs of the lumbar spine from 01/18/2024 were previously independently reviewed and interpreted, showing grade 1 spondylolisthesis at L3/4 and L4/5. Disc height loss at L4/5. No fracture or dislocation seen. Lordotic alignment.    MRI of the lumbar spine from 01/05/2024 was previously independently reviewed and interpreted, showing central stenosis at L3/4 and L4/5. Spondylolisthesis seen at L3/4 and L4/5. Foraminal stenosis on the right at L3/4. Foraminal stenosis bilaterally at L4/5.      Patient name: Nicole Joyce Patient MRN: [id number] Date of visit: 06/19/24

## 2024-07-02 DIAGNOSIS — E7800 Pure hypercholesterolemia, unspecified: Secondary | ICD-10-CM | POA: Diagnosis not present

## 2024-07-02 DIAGNOSIS — Z131 Encounter for screening for diabetes mellitus: Secondary | ICD-10-CM | POA: Diagnosis not present

## 2024-07-02 DIAGNOSIS — Z1159 Encounter for screening for other viral diseases: Secondary | ICD-10-CM | POA: Diagnosis not present

## 2024-07-02 DIAGNOSIS — M545 Low back pain, unspecified: Secondary | ICD-10-CM | POA: Diagnosis not present

## 2024-07-02 DIAGNOSIS — E559 Vitamin D deficiency, unspecified: Secondary | ICD-10-CM | POA: Diagnosis not present

## 2024-07-02 DIAGNOSIS — Z79899 Other long term (current) drug therapy: Secondary | ICD-10-CM | POA: Diagnosis not present

## 2024-07-02 DIAGNOSIS — M129 Arthropathy, unspecified: Secondary | ICD-10-CM | POA: Diagnosis not present

## 2024-07-02 DIAGNOSIS — R0602 Shortness of breath: Secondary | ICD-10-CM | POA: Diagnosis not present

## 2024-07-02 DIAGNOSIS — M546 Pain in thoracic spine: Secondary | ICD-10-CM | POA: Diagnosis not present

## 2024-07-03 DIAGNOSIS — M545 Low back pain, unspecified: Secondary | ICD-10-CM | POA: Diagnosis not present

## 2024-07-03 DIAGNOSIS — M546 Pain in thoracic spine: Secondary | ICD-10-CM | POA: Diagnosis not present

## 2024-07-05 DIAGNOSIS — J4520 Mild intermittent asthma, uncomplicated: Secondary | ICD-10-CM | POA: Diagnosis not present

## 2024-07-05 DIAGNOSIS — J309 Allergic rhinitis, unspecified: Secondary | ICD-10-CM | POA: Diagnosis not present

## 2024-07-09 DIAGNOSIS — Z79899 Other long term (current) drug therapy: Secondary | ICD-10-CM | POA: Diagnosis not present

## 2024-07-09 DIAGNOSIS — M51361 Other intervertebral disc degeneration, lumbar region with lower extremity pain only: Secondary | ICD-10-CM | POA: Diagnosis not present

## 2024-07-16 DIAGNOSIS — Z79899 Other long term (current) drug therapy: Secondary | ICD-10-CM | POA: Diagnosis not present

## 2024-07-31 DIAGNOSIS — R030 Elevated blood-pressure reading, without diagnosis of hypertension: Secondary | ICD-10-CM | POA: Diagnosis not present

## 2024-07-31 DIAGNOSIS — Z6835 Body mass index (BMI) 35.0-35.9, adult: Secondary | ICD-10-CM | POA: Diagnosis not present

## 2024-07-31 DIAGNOSIS — M51361 Other intervertebral disc degeneration, lumbar region with lower extremity pain only: Secondary | ICD-10-CM | POA: Diagnosis not present

## 2024-07-31 DIAGNOSIS — Z79899 Other long term (current) drug therapy: Secondary | ICD-10-CM | POA: Diagnosis not present

## 2024-08-19 ENCOUNTER — Encounter: Payer: Self-pay | Admitting: Radiology

## 2024-09-23 ENCOUNTER — Ambulatory Visit: Admitting: Family Medicine

## 2024-09-23 ENCOUNTER — Encounter: Payer: Self-pay | Admitting: Family Medicine

## 2024-09-23 VITALS — BP 138/78 | HR 80 | Temp 97.5°F | Ht 64.5 in | Wt 201.0 lb

## 2024-09-23 DIAGNOSIS — J4521 Mild intermittent asthma with (acute) exacerbation: Secondary | ICD-10-CM

## 2024-09-23 DIAGNOSIS — R052 Subacute cough: Secondary | ICD-10-CM

## 2024-09-23 DIAGNOSIS — J439 Emphysema, unspecified: Secondary | ICD-10-CM | POA: Diagnosis not present

## 2024-09-23 MED ORDER — PREDNISONE 20 MG PO TABS: 20.0000 mg | ORAL_TABLET | Freq: Two times a day (BID) | ORAL | 0 refills | Status: AC

## 2024-09-23 MED ORDER — ALBUTEROL SULFATE HFA 108 (90 BASE) MCG/ACT IN AERS: 1.0000 | INHALATION_SPRAY | RESPIRATORY_TRACT | 1 refills | Status: AC | PRN

## 2024-09-23 NOTE — Patient Instructions (Addendum)
 Stay well hydrated. Take either Mucinex (PLAIN) 12 hour twice daily and a Delsym syrup at bedtime, or you can get the combination and take the Mucinex DM 12 hour, and take that twice daily.  It depends on how much you are coughing during the day to decide if you want the cough suppressant (dextromethorphan) in your system all day. (Don't get Mucinex DM and Delsym together, you will be taking too much cough suppressant)  Consider using sinus rinses in the evening (to clear out what is in your head before you lay back at night, and it triggers more coughing).  Use albuterol  prn for wheezing, shortness of breath. Take the prednisone  twice daily for 5 days.  If you develop fever, or if your mucus or phlegm becomes discolored again, this can be a sign of a secondary bacterial infection, and you will need to follow-up and get antibiotics.

## 2024-09-23 NOTE — Progress Notes (Signed)
 Chief Complaint  Patient presents with   Cough    Started with cough and green, thick mucus on 09/05/24. Cough is better but still there, unproductive and lingering too long. Mucus is now white.    Has been sick for 2.5 weeks. She was much worse the first 4-5 days with flu-like symptoms. She has an ongoing cough. Cough was initially productive of discolored phlegm (green).  Cough is still productive, but is now white in color. Cough is worse at night, interfering with her sleep.  She feels a tickle in her throat when she breathes in at night. She denies nasal congestion or runny nose. If she coughs hard, that is when her nose gets moist.  She denies DOE/SOB.   She takes xyzal  once daily. She has albuterol  to use prn--she used up her last inhaler in the few weeks related to this illness.  Last used it 2 nights ago. She states she doesn't use it for shortness of breath, uses it to help bring up the phlegm.  She states she has a diagnosis of asthma, and a touch of COPD and emphysema. She was very sick last year with her breathing, lost a lot of weight. Ended up gaining weight with being on prednisone .   PMH, PSH, SH reviewed LBP, constipation, h/o PE, HLD, allergies, asthma/COPD  Outpatient Encounter Medications as of 09/23/2024  Medication Sig Note   Aspirin-Salicylamide-Caffeine (BC HEADACHE POWDER PO) Take 1 Package by mouth as needed. 09/23/2024: Took on last night   atorvastatin  (LIPITOR) 10 MG tablet TAKE 1 TABLET BY MOUTH EVERY DAY    gabapentin  (NEURONTIN ) 300 MG capsule Take 1 capsule (300 mg total) by mouth 3 (three) times daily. 09/23/2024: As needed   levocetirizine (XYZAL ) 5 MG tablet TAKE 1 TABLET (5 MG TOTAL) BY MOUTH EVERY EVENING. FOR ALLERGIES AND CONGESTION.    albuterol  (VENTOLIN  HFA) 108 (90 Base) MCG/ACT inhaler TAKE 2 PUFFS BY MOUTH EVERY 6 HOURS AS NEEDED FOR WHEEZE OR SHORTNESS OF BREATH (Patient not taking: Reported on 09/23/2024) 09/23/2024: Ran out from using  with this sickness   Cholecalciferol (VITAMIN D3) 1.25 MG (50000 UT) TABS Take 1 tablet by mouth once a week. (Patient not taking: Reported on 09/23/2024)    [DISCONTINUED] Fluticasone-Umeclidin-Vilant (TRELEGY ELLIPTA ) 200-62.5-25 MCG/ACT AEPB One inhalation daily for 28 days. (Patient not taking: Reported on 04/23/2024)    [DISCONTINUED] levofloxacin  (LEVAQUIN ) 500 MG tablet Take 1 tablet (500 mg total) by mouth daily. (Patient not taking: Reported on 04/23/2024)    No facility-administered encounter medications on file as of 09/23/2024.   No Known Allergies  ROS: no f/c, HA, dizziness. No n/v/d. No bleeding, bruising, rash. Sciatica--doesn't plan to see pain doctor (already has pain meds that she isn't needing). No chest pain, palpitations. Sore in chest from coughing.    PHYSICAL EXAM:  BP 138/78   Pulse 80   Temp (!) 97.5 F (36.4 C) (Tympanic)   Ht 5' 4.5 (1.638 m)   Wt 201 lb (91.2 kg)   BMI 33.97 kg/m   Pleasant female, with intermittent wet-sounding cough. She is speaking comfortably, in no distress HEENT: PERRL, EOMI, conjunctiva and sclera are clear. TM's and EAC's normal. Nasal mucosa is mildly edematous, no erythema or drainage. Sinuses are nontender. OP is clear, dentures. Neck: no lymphadenopathy or mass Heart: regular rate and rhythm Lungs with diffuse wheezing. Fair air movement. No rales. Psych: normal mood, affect, hygiene and grooming Neuro: alert and oriented, cranial nerves grossly intact. Normal gait.  ASSESSMENT/PLAN:  Subacute cough - initially viral URI, with persistent productive cough, without e/o infection.  Asthma flaring.  Mucinex and DM prn. Prednisone /albuterol .  Mild intermittent asthma with acute exacerbation - flaring since recent viral URI. Ran out of albuterol .  Short course of prednisone , refill albuterol . - Plan: albuterol  (VENTOLIN  HFA) 108 (90 Base) MCG/ACT inhaler, predniSONE  (DELTASONE ) 20 MG tablet  Pulmonary emphysema determined  by X-ray (HCC) - Plan: predniSONE  (DELTASONE ) 20 MG tablet  S/sx of bacterial infection reviewed.  F/u if sx persist/worsening   Stay well hydrated. Take either Mucinex (PLAIN) 12 hour twice daily and a Delsym syrup at bedtime, or you can get the combination and take the Mucinex DM 12 hour, and take that twice daily.  It depends on how much you are coughing during the day to decide if you want the cough suppressant (dextromethorphan) in your system all day. (Don't get Mucinex DM and Delsym together, you will be taking too much cough suppressant)  Consider using sinus rinses in the evening (to clear out what is in your head before you lay back at night, and it triggers more coughing).  Use albuterol  prn for wheezing, shortness of breath. Take the prednisone  twice daily for 5 days.   If you develop fever, or if your mucus or phlegm becomes discolored again, this can be a sign of a secondary bacterial infection, and you will need to follow-up and get antibiotics.

## 2025-02-03 ENCOUNTER — Encounter: Payer: Self-pay | Admitting: Nurse Practitioner

## 2025-04-29 ENCOUNTER — Ambulatory Visit: Payer: Self-pay
